# Patient Record
Sex: Male | Born: 1970 | Race: White | Hispanic: No | Smoking: Never smoker
Health system: Southern US, Community
[De-identification: ages and names within clinical notes are randomized; demographics above are authoritative.]

## PROBLEM LIST (undated history)

## (undated) DIAGNOSIS — I10 Essential (primary) hypertension: Secondary | ICD-10-CM

## (undated) DIAGNOSIS — M109 Gout, unspecified: Secondary | ICD-10-CM

## (undated) DIAGNOSIS — E785 Hyperlipidemia, unspecified: Secondary | ICD-10-CM

## (undated) HISTORY — DX: Gout, unspecified: M10.9

## (undated) HISTORY — DX: Hyperlipidemia, unspecified: E78.5

---

## 1997-05-13 HISTORY — PX: HAND RECONSTRUCTION: SHX1730

## 1998-02-26 ENCOUNTER — Emergency Department (HOSPITAL_COMMUNITY): Admission: EM | Admit: 1998-02-26 | Discharge: 1998-02-26 | Payer: Self-pay | Admitting: Emergency Medicine

## 1998-02-28 ENCOUNTER — Ambulatory Visit (HOSPITAL_COMMUNITY): Admission: RE | Admit: 1998-02-28 | Discharge: 1998-02-28 | Payer: Self-pay | Admitting: Orthopaedic Surgery

## 1998-06-01 ENCOUNTER — Emergency Department (HOSPITAL_COMMUNITY): Admission: EM | Admit: 1998-06-01 | Discharge: 1998-06-01 | Payer: Self-pay | Admitting: Emergency Medicine

## 2001-06-26 ENCOUNTER — Emergency Department (HOSPITAL_COMMUNITY): Admission: EM | Admit: 2001-06-26 | Discharge: 2001-06-26 | Payer: Self-pay | Admitting: Emergency Medicine

## 2011-10-11 ENCOUNTER — Encounter: Payer: Self-pay | Admitting: *Deleted

## 2012-03-31 ENCOUNTER — Encounter: Payer: Self-pay | Admitting: Cardiology

## 2013-07-18 ENCOUNTER — Emergency Department (HOSPITAL_BASED_OUTPATIENT_CLINIC_OR_DEPARTMENT_OTHER): Payer: BC Managed Care – PPO

## 2013-07-18 ENCOUNTER — Encounter (HOSPITAL_BASED_OUTPATIENT_CLINIC_OR_DEPARTMENT_OTHER): Payer: Self-pay | Admitting: Emergency Medicine

## 2013-07-18 ENCOUNTER — Emergency Department (HOSPITAL_BASED_OUTPATIENT_CLINIC_OR_DEPARTMENT_OTHER)
Admission: EM | Admit: 2013-07-18 | Discharge: 2013-07-18 | Disposition: A | Discharge status: Home / Self Care | Payer: BC Managed Care – PPO | Attending: Emergency Medicine | Admitting: Emergency Medicine

## 2013-07-18 DIAGNOSIS — Z79899 Other long term (current) drug therapy: Secondary | ICD-10-CM | POA: Insufficient documentation

## 2013-07-18 DIAGNOSIS — X58XXXA Exposure to other specified factors, initial encounter: Secondary | ICD-10-CM | POA: Insufficient documentation

## 2013-07-18 DIAGNOSIS — S93609A Unspecified sprain of unspecified foot, initial encounter: Secondary | ICD-10-CM | POA: Insufficient documentation

## 2013-07-18 DIAGNOSIS — Y939 Activity, unspecified: Secondary | ICD-10-CM | POA: Insufficient documentation

## 2013-07-18 DIAGNOSIS — Y929 Unspecified place or not applicable: Secondary | ICD-10-CM | POA: Insufficient documentation

## 2013-07-18 DIAGNOSIS — I10 Essential (primary) hypertension: Secondary | ICD-10-CM | POA: Insufficient documentation

## 2013-07-18 HISTORY — DX: Essential (primary) hypertension: I10

## 2013-07-18 MED ORDER — MELOXICAM 7.5 MG PO TABS: 7.5000 mg | ORAL_TABLET | Freq: Every day | ORAL | Status: DC

## 2013-07-18 MED ORDER — HYDROCODONE-ACETAMINOPHEN 5-325 MG PO TABS: 1.0000 | ORAL_TABLET | ORAL | Status: DC | PRN

## 2013-07-18 NOTE — ED Provider Notes (Signed)
Medical screening examination/treatment/procedure(s) were performed by non-physician practitioner and as supervising physician I was immediately available for consultation/collaboration.   EKG Interpretation None        Rolan BuccoMelanie Tayler Lassen, MD 07/18/13 1540

## 2013-07-18 NOTE — Discharge Instructions (Signed)
Foot Sprain The muscles and cord like structures which attach muscle to bone (tendons) that surround the feet are made up of units. A foot sprain can occur at the weakest spot in any of these units. This condition is most often caused by injury to or overuse of the foot, as from playing contact sports, or aggravating a previous injury, or from poor conditioning, or obesity. SYMPTOMS  Pain with movement of the foot.  Tenderness and swelling at the injury site.  Loss of strength is present in moderate or severe sprains. THE THREE GRADES OR SEVERITY OF FOOT SPRAIN ARE:  Mild (Grade I): Slightly pulled muscle without tearing of muscle or tendon fibers or loss of strength.  Moderate (Grade II): Tearing of fibers in a muscle, tendon, or at the attachment to bone, with small decrease in strength.  Severe (Grade III): Rupture of the muscle-tendon-bone attachment, with separation of fibers. Severe sprain requires surgical repair. Often repeating (chronic) sprains are caused by overuse. Sudden (acute) sprains are caused by direct injury or over-use. DIAGNOSIS  Diagnosis of this condition is usually by your own observation. If problems continue, a caregiver may be required for further evaluation and treatment. X-rays may be required to make sure there are not breaks in the bones (fractures) present. Continued problems may require physical therapy for treatment. PREVENTION  Use strength and conditioning exercises appropriate for your sport.  Warm up properly prior to working out.  Use athletic shoes that are made for the sport you are participating in.  Allow adequate time for healing. Early return to activities makes repeat injury more likely, and can lead to an unstable arthritic foot that can result in prolonged disability. Mild sprains generally heal in 3 to 10 days, with moderate and severe sprains taking 2 to 10 weeks. Your caregiver can help you determine the proper time required for  healing. HOME CARE INSTRUCTIONS   Apply ice to the injury for 15-20 minutes, 03-04 times per day. Put the ice in a plastic bag and place a towel between the bag of ice and your skin.  An elastic wrap (like an Ace bandage) may be used to keep swelling down.  Keep foot above the level of the heart, or at least raised on a footstool, when swelling and pain are present.  Try to avoid use other than gentle range of motion while the foot is painful. Do not resume use until instructed by your caregiver. Then begin use gradually, not increasing use to the point of pain. If pain does develop, decrease use and continue the above measures, gradually increasing activities that do not cause discomfort, until you gradually achieve normal use.  Use crutches if and as instructed, and for the length of time instructed.  Keep injured foot and ankle wrapped between treatments.  Massage foot and ankle for comfort and to keep swelling down. Massage from the toes up towards the knee.  Only take over-the-counter or prescription medicines for pain, discomfort, or fever as directed by your caregiver. SEEK IMMEDIATE MEDICAL CARE IF:   Your pain and swelling increase, or pain is not controlled with medications.  You have loss of feeling in your foot or your foot turns cold or blue.  You develop new, unexplained symptoms, or an increase of the symptoms that brought you to your caregiver. MAKE SURE YOU:   Understand these instructions.  Will watch your condition.  Will get help right away if you are not doing well or get worse. Document Released:   10/19/2001 Document Revised: 07/22/2011 Document Reviewed: 12/17/2007 [employer] Patient Information 2014 [employer].  

## 2013-07-18 NOTE — ED Notes (Signed)
Pain left great toe.  No known inj

## 2013-07-18 NOTE — ED Notes (Signed)
Disk given to Patient via Bradee Common

## 2013-07-18 NOTE — ED Provider Notes (Signed)
CSN: [id number]     Arrival date & time 07/18/13  1115 History   First MD Initiated Contact with Patient 07/18/13 1203     Chief Complaint  Patient presents with  . Toe Pain     (Consider location/radiation/quality/duration/timing/severity/associated sxs/prior Treatment) Patient is a 43 y.o. male presenting with toe pain. The history is provided by the patient.  Toe Pain This is a new problem. The current episode started in the past 7 days. The problem occurs constantly. The problem has been gradually worsening. The symptoms are aggravated by standing and walking. He has tried nothing for the symptoms.   Carlene Coriadward G Bookbinder is a 43 y.o. male who presents to the ED with pain in the left great toe that started 3 days ago and has gotten worse. He does not remember any injury to the area. The pain radiates to the top of the foot. The pain is moderate.  Past Medical History  Diagnosis Date  . Hypertension    Past Surgical History  Procedure Laterality Date  . Orthopedic surgery     Family History  Problem Relation Age of Onset  . Hypertension Mother   . Hypertension Father   . Cancer Father    History  Substance Use Topics  . Smoking status: Never Smoker   . Smokeless tobacco: Not on file  . Alcohol Use: No    Review of Systems Negative except as stated in HPI   Allergies  Review of patient's allergies indicates no known allergies.  Home Medications   Current Outpatient Rx  Name  Route  Sig  Dispense  Refill  . fish oil-omega-3 fatty acids 1000 MG capsule   Oral   Take 2 g by mouth daily.         . hydrochlorothiazide (HYDRODIURIL) 25 MG tablet   Oral   Take 25 mg by mouth daily.         Marland Kitchen. lisinopril (PRINIVIL,ZESTRIL) 5 MG tablet   Oral   Take 5 mg by mouth daily.          BP 146/76  Pulse 88  Temp(Src) 98.3 F (36.8 C) (Oral)  Resp 18  Ht 6' (1.829 m)  Wt 215 lb (97.523 kg)  BMI 29.15 kg/m2  SpO2 100% Physical Exam  Nursing note and vitals  reviewed. Constitutional: He is oriented to person, place, and time. He appears well-developed and well-nourished. No distress.  HENT:  Head: Normocephalic.  Eyes: EOM are normal.  Neck: Neck supple.  Cardiovascular: Normal rate.   Pulmonary/Chest: Effort normal.  Musculoskeletal: Normal range of motion.       Right foot: He exhibits tenderness and swelling. He exhibits normal range of motion, normal capillary refill, no deformity and no laceration.       Feet:  Tenderness with palpation to the base of the left great toe. Tender on the dorsum of the foot near the ankle. Pedal pulse strong, adequate circulation, good touch sensation.   Neurological: He is alert and oriented to person, place, and time. No cranial nerve deficit.  Skin: Skin is warm and dry.  Psychiatric: He has a normal mood and affect. His behavior is normal.    ED Course  Procedures  Dg Foot Complete Left  07/18/2013   CLINICAL DATA:  Foot pain.  No known injury.  EXAM: LEFT FOOT - COMPLETE 3+ VIEW  COMPARISON:  None.  FINDINGS: There is no evidence of fracture or dislocation. There is no evidence of arthropathy or other focal  bone abnormality. Soft tissues are unremarkable.  IMPRESSION: Negative.   Electronically Signed   By: Charlett Nose M.D.   On: 07/18/2013 12:22    MDM  43 y.o. male with pain in his left great toe and dorsum of the left foot. Will treat for foot sprain/tendonitis. Ace wrap applied, mediation for pain and inflammation. He will follow up with his PCP or return here as needed for worsening symptoms.  Discussed with the patient and all questioned fully answered.    Medication List    TAKE these medications       HYDROcodone-acetaminophen 5-325 MG per tablet  Commonly known as:  NORCO/VICODIN  Take 1 tablet by mouth every 4 (four) hours as needed.     meloxicam 7.5 MG tablet  Commonly known as:  MOBIC  Take 1 tablet (7.5 mg total) by mouth daily.      ASK your doctor about these medications        fish oil-omega-3 fatty acids 1000 MG capsule  Take 2 g by mouth daily.     hydrochlorothiazide 25 MG tablet  Commonly known as:  HYDRODIURIL  Take 25 mg by mouth daily.     lisinopril 5 MG tablet  Commonly known as:  PRINIVIL,ZESTRIL  Take 5 mg by mouth daily.         [facility] Orlene Och, NP 07/18/13 1520

## 2015-06-10 DIAGNOSIS — E782 Mixed hyperlipidemia: Secondary | ICD-10-CM | POA: Insufficient documentation

## 2015-06-10 DIAGNOSIS — J309 Allergic rhinitis, unspecified: Secondary | ICD-10-CM | POA: Insufficient documentation

## 2015-06-10 DIAGNOSIS — I10 Essential (primary) hypertension: Secondary | ICD-10-CM | POA: Insufficient documentation

## 2015-06-10 DIAGNOSIS — M5412 Radiculopathy, cervical region: Secondary | ICD-10-CM | POA: Insufficient documentation

## 2015-06-10 DIAGNOSIS — E559 Vitamin D deficiency, unspecified: Secondary | ICD-10-CM | POA: Insufficient documentation

## 2015-08-21 DIAGNOSIS — E781 Pure hyperglyceridemia: Secondary | ICD-10-CM | POA: Insufficient documentation

## 2016-05-02 ENCOUNTER — Encounter: Payer: Self-pay | Admitting: Family Medicine

## 2016-05-02 ENCOUNTER — Ambulatory Visit (INDEPENDENT_AMBULATORY_CARE_PROVIDER_SITE_OTHER): Payer: BLUE CROSS/BLUE SHIELD | Admitting: Family Medicine

## 2016-05-02 DIAGNOSIS — I10 Essential (primary) hypertension: Secondary | ICD-10-CM | POA: Insufficient documentation

## 2016-05-02 DIAGNOSIS — M109 Gout, unspecified: Secondary | ICD-10-CM

## 2016-05-02 MED ORDER — LISINOPRIL 20 MG PO TABS: 20.0000 mg | ORAL_TABLET | Freq: Every day | ORAL | 3 refills | Status: DC

## 2016-05-02 MED ORDER — METHYLPREDNISOLONE ACETATE 80 MG/ML IJ SUSP
80.0000 mg | Freq: Once | INTRAMUSCULAR | Status: AC
  Administered 2016-05-02: 80 mg via INTRAMUSCULAR

## 2016-05-02 MED ORDER — COLCHICINE 0.6 MG PO TABS: 0.6000 mg | ORAL_TABLET | Freq: Every day | ORAL | 1 refills | Status: DC

## 2016-05-02 NOTE — Assessment & Plan Note (Signed)
Stopped hydrochlorothiazide and sent new prescription for lisinopril

## 2016-05-02 NOTE — Progress Notes (Signed)
BP 127/88   Pulse 93   Temp 97.6 F (36.4 C) (Oral)   Ht 6' (1.829 m)   Wt 231 lb 8 oz (105 kg)   BMI 31.40 kg/m    Subjective:    Patient ID: Jordan DeutscherEdward L Webb, male    DOB: December 13, 1970, 45 y.o.   MRN: [id number]  HPI: Jordan Webb is a 45 y.o. male presenting on 05/02/2016 for Establish Care (history of gout, began about 5 weeks ago. Pain in right foot -  [place] saw a nurse who prescribed Alllopurinol, which has helped but still having episodes of pain.  Had an old prescription of Colchicine and took those but it didn't seem to help either.)   HPI Gout flare Patient is coming in today for a gout flare with been going on for about 5 weeks. He was seen at his workplace clinic and was started on allopurinol and had some colchicine but ran out of those. The gout flare is in his right great toe and second toe. He says over the past week it is spread to the second toe. He says he has had flares in his knees before but mostly involvement in his legs or his toes. He says that he has had about 4 flares a year for the past couple years. He is also been using Advil to help with this flare. He says currently his pain is actually down to 3 out of 10 but it keeps flaring up from day-to-day. He denies any fevers or chills. The redness and warmth has gone down significantly today.  Hypertension Patient comes to establish care with our office for hypertension. He is currently on lisinopril-hydrochlorothiazide. I recommended that he stop the hydrochlorothiazide because of the possibility of increasing uric acid and he thinks now after saying that the all of his gout might have started soon after the hydrochlorothiazide was started. This wasn't a bout 3 or 4 years ago. Patient denies headaches, blurred vision, chest pains, shortness of breath, or weakness.    Relevant past medical, surgical, family and social history reviewed and updated as indicated. Interim medical history since our last  visit reviewed. Allergies and medications reviewed and updated.  Review of Systems  Constitutional: Negative for chills and fever.  Respiratory: Negative for shortness of breath and wheezing.   Cardiovascular: Negative for chest pain and leg swelling.  Musculoskeletal: Positive for arthralgias and joint swelling. Negative for back pain and gait problem.  Skin: Negative for rash.  All other systems reviewed and are negative.   Per HPI unless specifically indicated above  Social History   Social History  . Marital status: Divorced    Spouse name: N/A  . Number of children: N/A  . Years of education: N/A   Occupational History  . Not on file.   Social History Main Topics  . Smoking status: Never Smoker  . Smokeless tobacco: Never Used  . Alcohol use No  . Drug use: No  . Sexual activity: Not Currently     Comment: divorced since 2012   Other Topics Concern  . Not on file   Social History Narrative  . No narrative on file    Past Surgical History:  Procedure Laterality Date  . HAND RECONSTRUCTION Right 1999  . ORTHOPEDIC SURGERY      Family History  Problem Relation Age of Onset  . Hypertension Mother   . Cancer Mother     bones, kidneys  . Hypertension Father   .  Cancer Father     skin cancer  . Heart disease Father     [id number] 341 years old  . Cancer Maternal Grandmother   62. Heart disease Maternal Grandfather   . Cancer Paternal Grandmother   . Heart disease Paternal Grandfather     Allergies as of 05/02/2016      Reactions   Prednisone Other (See Comments)      Medication List       Accurate as of 05/02/16  4:05 PM. Always use your most recent med list.          allopurinol 300 MG tablet Commonly known as:  ZYLOPRIM Take 300 mg by mouth daily.   colchicine 0.6 MG tablet Take 1 tablet (0.6 mg total) by mouth daily.   fish oil-omega-3 fatty acids 1000 MG capsule Take 2 g by mouth daily.   lisinopril 20 MG tablet Commonly known as:   PRINIVIL,ZESTRIL Take 1 tablet (20 mg total) by mouth daily.          Objective:    BP 127/88   Pulse 93   Temp 97.6 F (36.4 C) (Oral)   Ht 6' (1.829 m)   Wt 231 lb 8 oz (105 kg)   BMI 31.40 kg/m   Wt Readings from Last 3 Encounters:  05/02/16 231 lb 8 oz (105 kg)  07/18/13 215 lb (97.5 kg)    Physical Exam  Constitutional: He is oriented to person, place, and time. He appears well-developed and well-nourished. No distress.  Eyes: Conjunctivae are normal. Right eye exhibits no discharge. Left eye exhibits no discharge. No scleral icterus.  Cardiovascular: Normal rate, regular rhythm, normal heart sounds and intact distal pulses.   No murmur heard. Pulmonary/Chest: Effort normal and breath sounds normal. No respiratory distress. He has no wheezes. He has no rales.  Musculoskeletal: Normal range of motion. He exhibits no edema.       Right foot: There is tenderness (Slight tenderness to palpation and swelling and inflammation surrounding the right first and second MTP joints), bony tenderness and swelling. There is normal range of motion, normal capillary refill and no deformity.  Neurological: He is alert and oriented to person, place, and time. Coordination normal.  Skin: Skin is warm and dry. No rash noted. He is not diaphoretic.  Psychiatric: He has a normal mood and affect. His behavior is normal.  Nursing note and vitals reviewed.   No results found for this or any previous visit.    Assessment & Plan:   Problem List Items Addressed This Visit      Cardiovascular and Mediastinum   Essential hypertension, benign    Stopped hydrochlorothiazide and sent new prescription for lisinopril      Relevant Medications   lisinopril (PRINIVIL,ZESTRIL) 20 MG tablet     Other   Gout    Patient just started allopurinol 5 weeks ago, will stop hydrochlorothiazide and gave double shot, continue colchicine and written prescription for it      Relevant Medications   colchicine  0.6 MG tablet   methylPREDNISolone acetate (DEPO-MEDROL) injection 80 mg (Completed)   Other Relevant Orders   Uric acid       Follow up plan: Return in about 4 weeks (around 05/30/2016), or if symptoms worsen or fail to improve, for Hypertension recheck.  Arville CareJoshua Earleen Aoun, MD [facility] 05/02/2016, 4:05 PM

## 2016-05-02 NOTE — Assessment & Plan Note (Signed)
Patient just started allopurinol 5 weeks ago, will stop hydrochlorothiazide and gave double shot, continue colchicine and written prescription for it

## 2016-05-03 LAB — URIC ACID: Uric Acid: 4.7 mg/dL (ref 3.7–8.6)

## 2016-06-03 ENCOUNTER — Ambulatory Visit (INDEPENDENT_AMBULATORY_CARE_PROVIDER_SITE_OTHER): Payer: BLUE CROSS/BLUE SHIELD | Admitting: Family Medicine

## 2016-06-03 ENCOUNTER — Encounter: Payer: Self-pay | Admitting: Family Medicine

## 2016-06-03 VITALS — BP 148/90 | HR 67 | Temp 97.7°F | Ht 72.0 in | Wt 228.6 lb

## 2016-06-03 DIAGNOSIS — I10 Essential (primary) hypertension: Secondary | ICD-10-CM

## 2016-06-03 MED ORDER — LISINOPRIL 40 MG PO TABS: 40.0000 mg | ORAL_TABLET | Freq: Every day | ORAL | 2 refills | Status: DC

## 2016-06-03 NOTE — Progress Notes (Signed)
   BP (!) 143/93   Pulse 68   Temp 97.7 F (36.5 C) (Oral)   Ht 6' (1.829 m)   Wt 228 lb 9.6 oz (103.7 kg)   BMI 31.00 kg/m    Subjective:    Patient ID: Jordan Webb, male    DOB: 06/17/1970, 46 y.o.   MRN: [id number]  HPI: Jordan Deutscherdward L Webb is a 46 y.o. male presenting on 06/03/2016 for Hypertension (& go over labs that were done at work)   HPI Hypertension Patient is coming in today for hypertension recheck. His blood pressure is 143/93. Has been elevated as we took away his hydrochlorothiazide because of his gout. His blood pressure was doing well on just lisinopril 20 mg but now has started to increase and today is slightly elevated. Patient denies headaches, blurred vision, chest pains, shortness of breath, or weakness. Denies any side effects from medication and is content with current medication.   Relevant past medical, surgical, family and social history reviewed and updated as indicated. Interim medical history since our last visit reviewed. Allergies and medications reviewed and updated.  Review of Systems  Constitutional: Negative for chills and fever.  Eyes: Negative for discharge.  Respiratory: Negative for shortness of breath and wheezing.   Cardiovascular: Negative for chest pain and leg swelling.  Musculoskeletal: Negative for back pain and gait problem.  Skin: Negative for rash.  Neurological: Negative for dizziness, weakness, light-headedness and headaches.  All other systems reviewed and are negative.   Per HPI unless specifically indicated above      Objective:    BP (!) 143/93   Pulse 68   Temp 97.7 F (36.5 C) (Oral)   Ht 6' (1.829 m)   Wt 228 lb 9.6 oz (103.7 kg)   BMI 31.00 kg/m   Wt Readings from Last 3 Encounters:  06/03/16 228 lb 9.6 oz (103.7 kg)  05/02/16 231 lb 8 oz (105 kg)  07/18/13 215 lb (97.5 kg)    Physical Exam  Constitutional: He is oriented to person, place, and time. He appears well-developed and well-nourished. No  distress.  Eyes: Conjunctivae are normal. Right eye exhibits no discharge. Left eye exhibits no discharge. No scleral icterus.  Neck: Neck supple. No thyromegaly present.  Cardiovascular: Normal rate, regular rhythm, normal heart sounds and intact distal pulses.   No murmur heard. Pulmonary/Chest: Effort normal and breath sounds normal. No respiratory distress. He has no wheezes. He has no rales.  Musculoskeletal: Normal range of motion. He exhibits no edema.  Lymphadenopathy:    He has no cervical adenopathy.  Neurological: He is alert and oriented to person, place, and time. Coordination normal.  Skin: Skin is warm and dry. No rash noted. He is not diaphoretic.  Psychiatric: He has a normal mood and affect. His behavior is normal.  Nursing note and vitals reviewed.     Assessment & Plan:   Problem List Items Addressed This Visit      Cardiovascular and Mediastinum   Essential hypertension, benign - Primary   Relevant Medications   lisinopril (PRINIVIL,ZESTRIL) 40 MG tablet       Follow up plan: No Follow-up on file.  Counseling provided for all of the vaccine components No orders of the defined types were placed in this encounter.   Arville CareJoshua Dettinger, MD [facility] 06/03/2016, 5:10 PM

## 2016-07-12 ENCOUNTER — Ambulatory Visit (INDEPENDENT_AMBULATORY_CARE_PROVIDER_SITE_OTHER): Payer: BLUE CROSS/BLUE SHIELD | Admitting: Family Medicine

## 2016-07-12 ENCOUNTER — Encounter: Payer: Self-pay | Admitting: Family Medicine

## 2016-07-12 VITALS — BP 151/94 | HR 75 | Temp 97.8°F | Ht 72.0 in | Wt 232.1 lb

## 2016-07-12 DIAGNOSIS — I10 Essential (primary) hypertension: Secondary | ICD-10-CM | POA: Diagnosis not present

## 2016-07-12 MED ORDER — AMLODIPINE BESYLATE 5 MG PO TABS: 5.0000 mg | ORAL_TABLET | Freq: Every day | ORAL | 3 refills | Status: DC

## 2016-07-12 NOTE — Progress Notes (Signed)
BP (!) 151/94   Pulse 75   Temp 97.8 F (36.6 C) (Oral)   Ht 6' (1.829 m)   Wt 232 lb 2 oz (105.3 kg)   BMI 31.48 kg/m    Subjective:    Patient ID: Jordan Webb, male    DOB: Mar 23, 1971, 46 y.o.   MRN: [id number]  HPI: Jordan Webb is a 46 y.o. male presenting on 07/12/2016 for Hypertension (followup)   HPI Hypertension recheck Patient is coming in for a hypertension recheck. His blood pressure is 151/94. We had to stop hydrochlorothiazide because of his gout and have had him on lisinopril 40 mg to see how it do by itself. He denies any lightheadedness or dizziness or headaches. He denies any issues with his medication. He denies any chest pain or shortness of breath.  Relevant past medical, surgical, family and social history reviewed and updated as indicated. Interim medical history since our last visit reviewed. Allergies and medications reviewed and updated.  Review of Systems  Constitutional: Negative for chills and fever.  Respiratory: Negative for shortness of breath and wheezing.   Cardiovascular: Negative for chest pain, palpitations and leg swelling.  Musculoskeletal: Negative for back pain and gait problem.  Skin: Negative for rash.  Neurological: Negative for dizziness and light-headedness.  All other systems reviewed and are negative.   Per HPI unless specifically indicated above   Allergies as of 07/12/2016      Reactions   Prednisone Other (See Comments)      Medication List       Accurate as of 07/12/16  5:14 PM. Always use your most recent med list.          allopurinol 300 MG tablet Commonly known as:  ZYLOPRIM Take 300 mg by mouth daily.   amLODipine 5 MG tablet Commonly known as:  NORVASC Take 1 tablet (5 mg total) by mouth daily.   colchicine 0.6 MG tablet Take 1 tablet (0.6 mg total) by mouth daily.   fish oil-omega-3 fatty acids 1000 MG capsule Take 2 g by mouth daily.   lisinopril 40 MG tablet Commonly known as:   PRINIVIL,ZESTRIL Take 1 tablet (40 mg total) by mouth daily.          Objective:    BP (!) 151/94   Pulse 75   Temp 97.8 F (36.6 C) (Oral)   Ht 6' (1.829 m)   Wt 232 lb 2 oz (105.3 kg)   BMI 31.48 kg/m   Wt Readings from Last 3 Encounters:  07/12/16 232 lb 2 oz (105.3 kg)  06/03/16 228 lb 9.6 oz (103.7 kg)  05/02/16 231 lb 8 oz (105 kg)    Physical Exam  Constitutional: He is oriented to person, place, and time. He appears well-developed and well-nourished. No distress.  Eyes: Conjunctivae are normal. No scleral icterus.  Neck: Neck supple.  Cardiovascular: Normal rate, regular rhythm, normal heart sounds and intact distal pulses.   No murmur heard. Pulmonary/Chest: Effort normal and breath sounds normal. No respiratory distress. He has no wheezes. He has no rales.  Musculoskeletal: Normal range of motion. He exhibits no edema.  Lymphadenopathy:    He has no cervical adenopathy.  Neurological: He is alert and oriented to person, place, and time. Coordination normal.  Skin: Skin is warm and dry. No rash noted. He is not diaphoretic.  Psychiatric: He has a normal mood and affect. His behavior is normal.  Nursing note and vitals reviewed.     Assessment &  Plan:   Problem List Items Addressed This Visit      Cardiovascular and Mediastinum   Essential hypertension, benign - Primary   Relevant Medications   amLODipine (NORVASC) 5 MG tablet       Follow up plan: Return in about 4 weeks (around 08/09/2016), or if symptoms worsen or fail to improve, for Hypertension.  Counseling provided for all of the vaccine components No orders of the defined types were placed in this encounter.   Arville Care, MD [facility] 07/12/2016, 5:14 PM

## 2016-08-13 ENCOUNTER — Encounter: Payer: Self-pay | Admitting: Family Medicine

## 2016-08-13 ENCOUNTER — Ambulatory Visit (INDEPENDENT_AMBULATORY_CARE_PROVIDER_SITE_OTHER): Payer: BLUE CROSS/BLUE SHIELD | Admitting: Family Medicine

## 2016-08-13 VITALS — BP 139/84 | HR 76 | Temp 98.0°F | Ht 72.0 in | Wt 234.4 lb

## 2016-08-13 DIAGNOSIS — I10 Essential (primary) hypertension: Secondary | ICD-10-CM | POA: Diagnosis not present

## 2016-08-13 NOTE — Progress Notes (Signed)
BP 139/84   Pulse 76   Temp 98 F (36.7 C) (Oral)   Ht 6' (1.829 m)   Wt 234 lb 6 oz (106.3 kg)   BMI 31.79 kg/m    Subjective:    Patient ID: Jordan Webb, male    DOB: 1970-08-09, 46 y.o.   MRN: [id number]  HPI: Jordan Webb is a 46 y.o. male presenting on 08/13/2016 for Hypertension (4 week followup)   HPI Hypertension recheck Patient is coming in today for a blood pressure recheck. His blood pressure today is 139/84. He is currently on lisinopril and amlodipine, the amlodipine was added just at the last visit. He says he feels a lot better and feels a lot healthier. He denies any lightheadedness or dizziness. Patient denies headaches, blurred vision, chest pains, shortness of breath, or weakness. Denies any side effects from medication and is content with current medication. He says his headaches are gone that he used to have.  Relevant past medical, surgical, family and social history reviewed and updated as indicated. Interim medical history since our last visit reviewed. Allergies and medications reviewed and updated.  Review of Systems  Constitutional: Negative for chills and fever.  Respiratory: Negative for shortness of breath and wheezing.   Cardiovascular: Negative for chest pain and leg swelling.  Musculoskeletal: Negative for back pain and gait problem.  Skin: Negative for rash.  Neurological: Negative for dizziness, weakness, light-headedness, numbness and headaches.  All other systems reviewed and are negative.   Per HPI unless specifically indicated above   Allergies as of 08/13/2016      Reactions   Prednisone Other (See Comments)      Medication List       Accurate as of 08/13/16  4:51 PM. Always use your most recent med list.          allopurinol 300 MG tablet Commonly known as:  ZYLOPRIM Take 300 mg by mouth daily.   amLODipine 5 MG tablet Commonly known as:  NORVASC Take 1 tablet (5 mg total) by mouth daily.   colchicine 0.6 MG  tablet Take 1 tablet (0.6 mg total) by mouth daily.   fish oil-omega-3 fatty acids 1000 MG capsule Take 2 g by mouth daily.   lisinopril 40 MG tablet Commonly known as:  PRINIVIL,ZESTRIL Take 1 tablet (40 mg total) by mouth daily.          Objective:    BP 139/84   Pulse 76   Temp 98 F (36.7 C) (Oral)   Ht 6' (1.829 m)   Wt 234 lb 6 oz (106.3 kg)   BMI 31.79 kg/m   Wt Readings from Last 3 Encounters:  08/13/16 234 lb 6 oz (106.3 kg)  07/12/16 232 lb 2 oz (105.3 kg)  06/03/16 228 lb 9.6 oz (103.7 kg)    Physical Exam  Constitutional: He is oriented to person, place, and time. He appears well-developed and well-nourished. No distress.  Eyes: Conjunctivae are normal. No scleral icterus.  Neck: Neck supple. No thyromegaly present.  Cardiovascular: Normal rate, regular rhythm, normal heart sounds and intact distal pulses.   No murmur heard. Pulmonary/Chest: Effort normal and breath sounds normal. No respiratory distress. He has no wheezes.  Musculoskeletal: Normal range of motion. He exhibits no edema.  Lymphadenopathy:    He has no cervical adenopathy.  Neurological: He is alert and oriented to person, place, and time. Coordination normal.  Skin: Skin is warm and dry. No rash noted. He is not  diaphoretic.  Psychiatric: He has a normal mood and affect. His behavior is normal.  Nursing note and vitals reviewed.     Assessment & Plan:   Problem List Items Addressed This Visit      Cardiovascular and Mediastinum   Essential hypertension, benign - Primary    Continue blood pressure medication with no changes.   Follow up plan: Return if symptoms worsen or fail to improve, for Hypertension.  Counseling provided for all of the vaccine components No orders of the defined types were placed in this encounter.   Arville Care, MD [facility] 08/13/2016, 4:51 PM

## 2016-10-04 ENCOUNTER — Telehealth: Payer: Self-pay | Admitting: Family Medicine

## 2016-10-04 DIAGNOSIS — M109 Gout, unspecified: Secondary | ICD-10-CM

## 2016-10-04 MED ORDER — COLCHICINE 0.6 MG PO TABS: 0.6000 mg | ORAL_TABLET | Freq: Every day | ORAL | 1 refills | Status: DC

## 2016-10-04 NOTE — Telephone Encounter (Signed)
Patient is requesting a refill on his colchicine. Last seen 08/13/16- last refilled 05/02/16 30 r-1. Please advise

## 2016-10-04 NOTE — Telephone Encounter (Signed)
What is the name of the medication? Colchicine for gout  Have you contacted your pharmacy to request a refill? no  Which pharmacy would you like this sent to? [facility] in [place].   Patient notified that their request is being sent to the clinical staff for review and that they should receive a call once it is complete. If they do not receive a call within 24 hours they can check with their pharmacy or our office.

## 2016-10-04 NOTE — Telephone Encounter (Signed)
Rx sent. Patient aware.  

## 2016-10-04 NOTE — Telephone Encounter (Signed)
Yes go ahead and send a refill for him.

## 2016-11-11 ENCOUNTER — Encounter: Payer: Self-pay | Admitting: Family Medicine

## 2016-11-11 ENCOUNTER — Ambulatory Visit (INDEPENDENT_AMBULATORY_CARE_PROVIDER_SITE_OTHER): Payer: Commercial Managed Care - PPO | Admitting: Family Medicine

## 2016-11-11 VITALS — BP 132/82 | HR 83 | Temp 97.8°F | Ht 72.0 in | Wt 229.0 lb

## 2016-11-11 DIAGNOSIS — M25562 Pain in left knee: Secondary | ICD-10-CM | POA: Diagnosis not present

## 2016-11-11 DIAGNOSIS — M10062 Idiopathic gout, left knee: Secondary | ICD-10-CM | POA: Diagnosis not present

## 2016-11-11 LAB — CBC WITH DIFFERENTIAL/PLATELET
Basophils Absolute: 0 10*3/uL (ref 0.0–0.2)
Basos: 0 %
EOS (ABSOLUTE): 0.2 10*3/uL (ref 0.0–0.4)
EOS: 3 %
Hematocrit: 38.9 % (ref 37.5–51.0)
Hemoglobin: 13.5 g/dL (ref 13.0–17.7)
Lymphocytes Absolute: 2.1 10*3/uL (ref 0.7–3.1)
Lymphs: 29 %
MCH: 28.7 pg (ref 26.6–33.0)
MCHC: 34.7 g/dL (ref 31.5–35.7)
MCV: 83 fL (ref 79–97)
MONOS ABS: 0.8 10*3/uL (ref 0.1–0.9)
Monocytes: 12 %
Neutrophils Absolute: 4 10*3/uL (ref 1.4–7.0)
Neutrophils: 56 %
Platelets: 219 10*3/uL (ref 150–379)
RBC: 4.71 x10E6/uL (ref 4.14–5.80)
RDW: 13.9 % (ref 12.3–15.4)
WBC: 7.1 10*3/uL (ref 3.4–10.8)

## 2016-11-11 NOTE — Progress Notes (Signed)
0

## 2016-11-11 NOTE — Progress Notes (Signed)
Chief Complaint  Patient presents with  . Knee Pain    pt here today c/o left knee pain and some swelling. No recent injury, but has been walking a lot more the past week.    HPI  Patient presents today for 5 days of moderately severe left knee pain he had no known injury. He's been walking a lot over the last week but denies any misstep stumbling and falling twisting etc. He does have a history of gout. He takes allopurinol regularly. He has not tried his colchicine for this occurrence. He notes that the joint feels hot to him.  PMH: Smoking status noted ROS: Per HPI  Objective: BP 132/82   Pulse 83   Temp 97.8 F (36.6 C) (Oral)   Ht 6' (1.829 m)   Wt 229 lb (103.9 kg)   BMI 31.06 kg/m  Gen: NAD, alert, cooperative with exam HEENT: NCAT, EOMI, PERRL CV: RRR, good S1/S2, no murmur Resp: CTABL, no wheezes, non-labored Ext: No edema, warm the left knee is warm to touch mild erythema noted. Minimal effusion. There is moderate tenderness at the anterior joint line and patellar plica. Neuro: Alert and oriented, No gross deficits  Assessment and plan:  1. Acute pain of left knee   2. Acute idiopathic gout of left knee     Meds ordered this encounter  Medications  . VITAMIN D, ERGOCALCIFEROL, PO    Sig: Take by mouth.    Orders Placed This Encounter  Procedures  . CBC with Differential/Platelet  . Uric acid   Due to the possibility of low-grade infection of the joint has noted from the redness and heat , a stat CBC was sent and is pending at this time Follow up as needed.  Mechele ClaudeWarren Hudsyn Barich, MD

## 2016-11-12 ENCOUNTER — Other Ambulatory Visit: Payer: Self-pay | Admitting: Family Medicine

## 2016-11-12 LAB — URIC ACID: Uric Acid: 5.3 mg/dL (ref 3.7–8.6)

## 2016-11-12 MED ORDER — ALLOPURINOL 300 MG PO TABS: 300.0000 mg | ORAL_TABLET | Freq: Every day | ORAL | 1 refills | Status: DC

## 2016-11-12 NOTE — Telephone Encounter (Signed)
Pt aware and med refilled

## 2017-02-12 ENCOUNTER — Ambulatory Visit (INDEPENDENT_AMBULATORY_CARE_PROVIDER_SITE_OTHER): Payer: Commercial Managed Care - PPO | Admitting: Family Medicine

## 2017-02-12 ENCOUNTER — Encounter: Payer: Self-pay | Admitting: Family Medicine

## 2017-02-12 VITALS — BP 125/81 | HR 83 | Temp 98.3°F | Ht 72.0 in | Wt 235.0 lb

## 2017-02-12 DIAGNOSIS — I10 Essential (primary) hypertension: Secondary | ICD-10-CM

## 2017-02-12 DIAGNOSIS — B078 Other viral warts: Secondary | ICD-10-CM | POA: Diagnosis not present

## 2017-02-12 MED ORDER — AMLODIPINE BESYLATE 5 MG PO TABS: 5.0000 mg | ORAL_TABLET | Freq: Every day | ORAL | 1 refills | Status: DC

## 2017-02-12 MED ORDER — LISINOPRIL 40 MG PO TABS: 40.0000 mg | ORAL_TABLET | Freq: Every day | ORAL | 1 refills | Status: DC

## 2017-02-12 NOTE — Progress Notes (Signed)
BP 125/81   Pulse 83   Temp 98.3 F (36.8 C) (Oral)   Ht 6' (1.829 m)   Wt 235 lb (106.6 kg)   BMI 31.87 kg/m    Subjective:    Patient ID: Jordan Webb, male    DOB: Sep 25, 1970, 46 y.o.   MRN: [id number]  HPI: Jordan Webb is a 46 y.o. male presenting on 02/12/2017 for Discuss labwork and Medication Refill   HPI Hypertension Patient is currently on amlodipine and lisinopril, and their blood pressure today is 125/81. Patient denies any lightheadedness or dizziness. Patient denies headaches, blurred vision, chest pains, shortness of breath, or weakness. Denies any side effects from medication and is content with current medication.   Wart Patient has had a wart that he is tried multiple treatments for on his left forearm including home treatments and one previous cryotherapy about a year ago. He denies any drainage or pain fevers or chills. He would like to go ahead and try and get it removed again. He said he recently just caught on something and pulled it off and it bled and is really irritated and that's why he wants to get it removed.  Relevant past medical, surgical, family and social history reviewed and updated as indicated. Interim medical history since our last visit reviewed. Allergies and medications reviewed and updated.  Review of Systems  Constitutional: Negative for chills and fever.  Respiratory: Negative for shortness of breath and wheezing.   Cardiovascular: Negative for chest pain and leg swelling.  Musculoskeletal: Negative for back pain and gait problem.  Skin: Negative for rash.  Neurological: Negative for dizziness, weakness, light-headedness, numbness and headaches.  Psychiatric/Behavioral: Negative for confusion, decreased concentration, self-injury, sleep disturbance and suicidal ideas.  All other systems reviewed and are negative.   Per HPI unless specifically indicated above        Objective:    BP 125/81   Pulse 83   Temp 98.3 F  (36.8 C) (Oral)   Ht 6' (1.829 m)   Wt 235 lb (106.6 kg)   BMI 31.87 kg/m   Wt Readings from Last 3 Encounters:  02/12/17 235 lb (106.6 kg)  11/11/16 229 lb (103.9 kg)  08/13/16 234 lb 6 oz (106.3 kg)    Physical Exam  Constitutional: He is oriented to person, place, and time. He appears well-developed and well-nourished. No distress.  Eyes: Conjunctivae are normal. No scleral icterus.  Neck: Neck supple. No thyromegaly present.  Cardiovascular: Normal rate, regular rhythm, normal heart sounds and intact distal pulses.   No murmur heard. Pulmonary/Chest: Effort normal and breath sounds normal. No respiratory distress. He has no wheezes. He has no rales.  Musculoskeletal: Normal range of motion. He exhibits no edema.  Lymphadenopathy:    He has no cervical adenopathy.  Neurological: He is alert and oriented to person, place, and time. Coordination normal.  Skin: Skin is warm and dry. Lesion (wart on left posterior forearm ) noted. No rash noted. He is not diaphoretic.  Psychiatric: He has a normal mood and affect. His behavior is normal.  Nursing note and vitals reviewed.   Cryotherapy of wart: Patient is coming in for cryotherapy of wart on left posterior forearm, applied 3-10 second bursts. Patient was warned about blistering and keeping the site clean and to return in 2-3 weeks if not resolved    Assessment & Plan:   Problem List Items Addressed This Visit      Cardiovascular and Mediastinum   Essential  hypertension, benign   Relevant Medications   lisinopril (PRINIVIL,ZESTRIL) 40 MG tablet   amLODipine (NORVASC) 5 MG tablet       Follow up plan: Return in about 6 months (around 08/13/2017), or if symptoms worsen or fail to improve, for Hypertension recheck and physical.  Counseling provided for all of the vaccine components No orders of the defined types were placed in this encounter.   Arville Care, MD [facility] 02/12/2017, 3:30  PM

## 2017-06-27 ENCOUNTER — Other Ambulatory Visit: Payer: Self-pay | Admitting: Family Medicine

## 2017-06-27 DIAGNOSIS — I10 Essential (primary) hypertension: Secondary | ICD-10-CM

## 2017-06-27 MED ORDER — AMLODIPINE BESYLATE 5 MG PO TABS: 5.0000 mg | ORAL_TABLET | Freq: Every day | ORAL | 0 refills | Status: DC

## 2017-06-27 NOTE — Telephone Encounter (Signed)
Pt aware refill sent to pharmacy 

## 2017-09-01 ENCOUNTER — Encounter: Payer: Self-pay | Admitting: Family Medicine

## 2017-09-01 ENCOUNTER — Ambulatory Visit (INDEPENDENT_AMBULATORY_CARE_PROVIDER_SITE_OTHER): Payer: Commercial Managed Care - PPO | Admitting: Family Medicine

## 2017-09-01 VITALS — BP 130/89 | HR 72 | Temp 97.8°F | Ht 72.0 in | Wt 235.0 lb

## 2017-09-01 DIAGNOSIS — I10 Essential (primary) hypertension: Secondary | ICD-10-CM

## 2017-09-01 DIAGNOSIS — M1A9XX0 Chronic gout, unspecified, without tophus (tophi): Secondary | ICD-10-CM | POA: Diagnosis not present

## 2017-09-01 MED ORDER — AMLODIPINE BESYLATE 5 MG PO TABS: 5.0000 mg | ORAL_TABLET | Freq: Every day | ORAL | 3 refills | Status: DC

## 2017-09-01 MED ORDER — LISINOPRIL 40 MG PO TABS: 40.0000 mg | ORAL_TABLET | Freq: Every day | ORAL | 3 refills | Status: DC

## 2017-09-01 NOTE — Progress Notes (Signed)
BP 130/89   Pulse 72   Temp 97.8 F (36.6 C) (Oral)   Ht 6' (1.829 m)   Wt 235 lb (106.6 kg)   BMI 31.87 kg/m    Subjective:    Patient ID: Jordan Webb, male    DOB: 1970/10/08, 47 y.o.   MRN: [id number]  HPI: Jordan Webb is a 47 y.o. male presenting on 09/01/2017 for Medication Refill   HPI Hypertension Patient is currently on amlodipine and lisinopril, and their blood pressure today is 130/89. Patient denies any lightheadedness or dizziness. Patient denies headaches, blurred vision, chest pains, shortness of breath, or weakness. Denies any side effects from medication and is content with current medication.   Gout Patient has made some dietary changes and came off the allopurinol 7 months ago and has not had any issues since and is happy with where he is at currently.  Relevant past medical, surgical, family and social history reviewed and updated as indicated. Interim medical history since our last visit reviewed. Allergies and medications reviewed and updated.  Review of Systems  Constitutional: Negative for chills and fever.  Eyes: Negative for visual disturbance.  Respiratory: Negative for cough, shortness of breath and wheezing.   Cardiovascular: Negative for chest pain, palpitations and leg swelling.  Gastrointestinal: Negative for abdominal pain, blood in stool, constipation and diarrhea.  Genitourinary: Negative for dysuria and hematuria.  Musculoskeletal: Negative for back pain and myalgias.  Skin: Negative for rash.  Neurological: Negative for dizziness, weakness and headaches.  Psychiatric/Behavioral: Negative for suicidal ideas.    Per HPI unless specifically indicated above   Allergies as of 09/01/2017      Reactions   Prednisone Other (See Comments)      Medication List        Accurate as of 09/01/17  2:44 PM. Always use your most recent med list.          allopurinol 300 MG tablet Commonly known as:  ZYLOPRIM Take 1 tablet (300 mg  total) by mouth daily.   amLODipine 5 MG tablet Commonly known as:  NORVASC Take 1 tablet (5 mg total) by mouth daily.   colchicine 0.6 MG tablet Take 1 tablet (0.6 mg total) by mouth daily.   lisinopril 40 MG tablet Commonly known as:  PRINIVIL,ZESTRIL Take 1 tablet (40 mg total) by mouth daily.   VITAMIN D (ERGOCALCIFEROL) PO Take by mouth.          Objective:    BP 130/89   Pulse 72   Temp 97.8 F (36.6 C) (Oral)   Ht 6' (1.829 m)   Wt 235 lb (106.6 kg)   BMI 31.87 kg/m   Wt Readings from Last 3 Encounters:  09/01/17 235 lb (106.6 kg)  02/12/17 235 lb (106.6 kg)  11/11/16 229 lb (103.9 kg)    Physical Exam  Constitutional: He is oriented to person, place, and time. He appears well-developed and well-nourished. No distress.  Eyes: Conjunctivae are normal. Right eye exhibits no discharge. No scleral icterus.  Neck: Neck supple. No thyromegaly present.  Cardiovascular: Normal rate, regular rhythm, normal heart sounds and intact distal pulses.  No murmur heard. Pulmonary/Chest: Effort normal and breath sounds normal. No respiratory distress. He has no wheezes.  Musculoskeletal: Normal range of motion. He exhibits no edema.  Lymphadenopathy:    He has no cervical adenopathy.  Neurological: He is alert and oriented to person, place, and time. Coordination normal.  Skin: Skin is warm and dry. No rash  noted. He is not diaphoretic.  Psychiatric: He has a normal mood and affect. His behavior is normal.  Nursing note and vitals reviewed.       Assessment & Plan:   Problem List Items Addressed This Visit      Cardiovascular and Mediastinum   Essential hypertension, benign - Primary   Relevant Medications   amLODipine (NORVASC) 5 MG tablet   lisinopril (PRINIVIL,ZESTRIL) 40 MG tablet     Other   Gout      BP looking great, labs from work look great, his creatinine was 1.26 which is borderline but otherwise his liver function and cholesterol and blood sugar  look great.  His A1c was 5.7.  Patient has been off allopurinol for 7 months and we will continue to be off of it unless he starts to have any further issues Follow up plan: Return in about 1 year (around 09/02/2018), or if symptoms worsen or fail to improve, for Hypertension recheck.  Counseling provided for all of the vaccine components No orders of the defined types were placed in this encounter.   Arville CareJoshua Olukemi Panchal, MD [facility] 09/01/2017, 2:44 PM

## 2017-12-31 ENCOUNTER — Other Ambulatory Visit: Payer: Self-pay | Admitting: Family Medicine

## 2017-12-31 NOTE — Telephone Encounter (Signed)
Pt notified he has refills of Amlodipine

## 2018-03-17 ENCOUNTER — Other Ambulatory Visit: Payer: Self-pay

## 2018-03-17 ENCOUNTER — Emergency Department (HOSPITAL_COMMUNITY)
Admission: EM | Admit: 2018-03-17 | Discharge: 2018-03-17 | Disposition: A | Discharge status: Home / Self Care | Payer: Commercial Managed Care - PPO | Attending: Emergency Medicine | Admitting: Emergency Medicine

## 2018-03-17 ENCOUNTER — Emergency Department (HOSPITAL_COMMUNITY): Payer: Commercial Managed Care - PPO

## 2018-03-17 ENCOUNTER — Encounter (HOSPITAL_COMMUNITY): Payer: Self-pay | Admitting: Emergency Medicine

## 2018-03-17 DIAGNOSIS — I10 Essential (primary) hypertension: Secondary | ICD-10-CM | POA: Insufficient documentation

## 2018-03-17 DIAGNOSIS — Z79899 Other long term (current) drug therapy: Secondary | ICD-10-CM | POA: Diagnosis not present

## 2018-03-17 DIAGNOSIS — R0789 Other chest pain: Secondary | ICD-10-CM | POA: Diagnosis present

## 2018-03-17 LAB — CBC
HCT: 45.9 % (ref 39.0–52.0)
Hemoglobin: 14.9 g/dL (ref 13.0–17.0)
MCH: 27.9 pg (ref 26.0–34.0)
MCHC: 32.5 g/dL (ref 30.0–36.0)
MCV: 85.8 fL (ref 80.0–100.0)
PLATELETS: 287 10*3/uL (ref 150–400)
RBC: 5.35 MIL/uL (ref 4.22–5.81)
RDW: 12.6 % (ref 11.5–15.5)
WBC: 7.4 10*3/uL (ref 4.0–10.5)
nRBC: 0 % (ref 0.0–0.2)

## 2018-03-17 LAB — BASIC METABOLIC PANEL
Anion gap: 8 (ref 5–15)
BUN: 13 mg/dL (ref 6–20)
CALCIUM: 9.6 mg/dL (ref 8.9–10.3)
CO2: 26 mmol/L (ref 22–32)
CREATININE: 1.36 mg/dL — AB (ref 0.61–1.24)
Chloride: 103 mmol/L (ref 98–111)
GFR calc Af Amer: >60 mL/min (ref >60)
GFR calc non Af Amer: >60 mL/min (ref >60)
Glucose, Bld: 106 mg/dL — ABNORMAL HIGH (ref 70–99)
Potassium: 3.4 mmol/L — ABNORMAL LOW (ref 3.5–5.1)
Sodium: 137 mmol/L (ref 135–145)

## 2018-03-17 LAB — I-STAT TROPONIN, ED
TROPONIN I, POC: 0 ng/mL (ref 0.00–0.08)
Troponin i, poc: 0 ng/mL (ref 0.00–0.08)

## 2018-03-17 NOTE — ED Triage Notes (Signed)
Pt reports that over the last 4 hours pt has had shortness of breath and mild chest pain radiating to left arm while laying down. Denies active chest pain at this time.

## 2018-03-17 NOTE — Discharge Instructions (Signed)
You have had two negative sets of cardiac enzymes.  Your chest xray was clear.  Please follow up with your doctor to schedule outpatient stress testing.

## 2018-03-17 NOTE — ED Provider Notes (Signed)
TIME SEEN: 5:04 AM  CHIEF COMPLAINT: chest pain  HPI: Patient is a 47 year old male with history of hypertension, borderline hyperlipidemia who presents to the emergency department with an episode of chest pain that started last night around 9:30 PM while at rest.  Describes as a "pressing" left-sided chest pain without radiation.  He did feel short of breath but no nausea, vomiting, dizziness or diaphoresis.  He is now asymptomatic.  No aggravating or alleviating factors.  He is not a smoker.  His dad did have a heart attack in his 69s but no history of premature CAD in his family.  Last stress test was approximately 8 to 9 years ago and was normal.  No history of cardiac catheterization.  He is slightly hypertensive here but has not had his home medications this morning.  No history of PE, DVT, exogenous estrogen use, recent fractures, surgery, trauma, hospitalization or prolonged travel. No lower extremity swelling or pain. No calf tenderness.   ROS: See HPI Constitutional: no fever  Eyes: no drainage  ENT: no runny nose   Cardiovascular:  chest pain  Resp: SOB  GI: no vomiting GU: no dysuria Integumentary: no rash  Allergy: no hives  Musculoskeletal: no leg swelling  Neurological: no slurred speech ROS otherwise negative  PAST MEDICAL HISTORY/PAST SURGICAL HISTORY:  Past Medical History:  Diagnosis Date  . Gout   . Hyperlipidemia   . Hypertension     MEDICATIONS:  Prior to Admission medications   Medication Sig Start Date End Date Taking? Authorizing Provider  allopurinol (ZYLOPRIM) 300 MG tablet Take 1 tablet (300 mg total) by mouth daily. 11/12/16   Dettinger, Elige Radon, MD  amLODipine (NORVASC) 5 MG tablet Take 1 tablet (5 mg total) by mouth daily. 09/01/17   Dettinger, Elige Radon, MD  colchicine 0.6 MG tablet Take 1 tablet (0.6 mg total) by mouth daily. 10/04/16   Dettinger, Elige Radon, MD  lisinopril (PRINIVIL,ZESTRIL) 40 MG tablet Take 1 tablet (40 mg total) by mouth daily.  09/01/17   Dettinger, Elige Radon, MD  VITAMIN D, ERGOCALCIFEROL, PO Take by mouth.    [provider]    ALLERGIES:  Allergies  Allergen Reactions  . Prednisone Other (See Comments)    SOCIAL HISTORY:  Social History   Tobacco Use  . Smoking status: Never Smoker  . Smokeless tobacco: Never Used  Substance Use Topics  . Alcohol use: No    FAMILY HISTORY: Family History  Problem Relation Age of Onset  . Hypertension Mother   . Cancer Mother        bones, kidneys  . Hypertension Father   . Cancer Father        skin cancer  . Heart disease Father        [id number] 48 years old  . Cancer Maternal Grandmother   . Heart disease Maternal Grandfather   . Cancer Paternal Grandmother   . Heart disease Paternal Grandfather     EXAM: BP (!) 143/99 (BP Location: Left Arm)   Pulse 73   Temp 98.5 F (36.9 C) (Oral)   Resp 18   SpO2 96%  CONSTITUTIONAL: Alert and oriented and responds appropriately to questions. Well-appearing; well-nourished HEAD: Normocephalic EYES: Conjunctivae clear, pupils appear equal, EOMI ENT: normal nose; moist mucous membranes NECK: Supple, no meningismus, no nuchal rigidity, no LAD  CARD: RRR; S1 and S2 appreciated; no murmurs, no clicks, no rubs, no gallops RESP: Normal chest excursion without splinting or tachypnea; breath sounds clear and equal  bilaterally; no wheezes, no rhonchi, no rales, no hypoxia or respiratory distress, speaking full sentences ABD/GI: Normal bowel sounds; non-distended; soft, non-tender, no rebound, no guarding, no peritoneal signs, no hepatosplenomegaly BACK:  The back appears normal and is non-tender to palpation, there is no CVA tenderness EXT: Normal ROM in all joints; non-tender to palpation; no edema; normal capillary refill; no cyanosis, no calf tenderness or swelling    SKIN: Normal color for age and race; warm; no rash NEURO: Moves all extremities equally PSYCH: The patient's mood and manner are appropriate.  Grooming and personal hygiene are appropriate.  MEDICAL DECISION MAKING: Patient here with chest pain.  Currently asymptomatic.  States he was aggravated when this happened and thinks this may have been due to stress.  EKG shows no ischemic abnormality, arrhythmia, interval changes.  He has had 2 negative troponins.  Chest x-ray is clear.  He is PERC negative.  Doubt dissection.  I have recommended close follow-up with his outpatient PCP to schedule outpatient stress test.  His heart score is 3.  I do not feel he needs admission at this time.  He is comfortable with this plan.  He will take his blood pressure medicine when he gets home today.   At this time, I do not feel there is any life-threatening condition present. I have reviewed and discussed all results (EKG, imaging, lab, urine as appropriate) and exam findings with patient/family. I have reviewed nursing notes and appropriate previous records.  I feel the patient is safe to be discharged home without further emergent workup and can continue workup as an outpatient as needed. Discussed usual and customary return precautions. Patient/family verbalize understanding and are comfortable with this plan.  Outpatient follow-up has been provided if needed. All questions have been answered.     EKG Interpretation  Date/Time:  Tuesday March 17 2018 01:00:15 EST Ventricular Rate:  82 PR Interval:  130 QRS Duration: 88 QT Interval:  364 QTC Calculation: 425 R Axis:   59 Text Interpretation:  Normal sinus rhythm Normal ECG No old tracing to compare Confirmed by Dione Booze ([id number]) on 03/17/2018 3:16:25 AM         Suyash Amory, Layla Maw, DO 03/17/18 0510

## 2018-04-01 ENCOUNTER — Encounter: Payer: Self-pay | Admitting: Family Medicine

## 2018-04-01 ENCOUNTER — Ambulatory Visit (INDEPENDENT_AMBULATORY_CARE_PROVIDER_SITE_OTHER): Payer: Commercial Managed Care - PPO | Admitting: Family Medicine

## 2018-04-01 VITALS — BP 115/75 | HR 73 | Temp 97.3°F | Ht 72.0 in | Wt 232.6 lb

## 2018-04-01 DIAGNOSIS — I208 Other forms of angina pectoris: Secondary | ICD-10-CM

## 2018-04-01 DIAGNOSIS — R7989 Other specified abnormal findings of blood chemistry: Secondary | ICD-10-CM

## 2018-04-01 NOTE — Progress Notes (Signed)
BP 115/75   Pulse 73   Temp (!) 97.3 F (36.3 C) (Oral)   Ht 6' (1.829 m)   Wt 105.5 kg   BMI 31.55 kg/m    Subjective:    Patient ID: Jordan Webb, male    DOB: 29-Aug-1970, 47 y.o.   MRN: [id number]  HPI: Jordan Webb is a 47 y.o. male presenting on 04/01/2018 for Hospitalization Follow-up [facility] 11/5- chest pain. Hospital recomends that patient gets a stress test done.  Patient states that he has not had any chest pain since leaving the hospital.)   Patient presents for ER f/u for chest pain that presented 2 weeks ago. Patient arrived home from work and felt a pressure on his chest. The chest pain progressed throughout the evening and he felt pain down his left arm at which he decided to go the the ER. He denies having nausea, vomiting, diaphoresis, chest pain, or sob. The work up in the ER showed negative troponin and normal EKG. Since the patient has a family history of MI on both sides it was recommend he f/u with a cardiologist for a stress test. The patient also mentioned he had a stressful day and felt more anxious than normal and thought his episode might be related to stress. Since the ER visit the patient denies any further chest pain episodes. Patient has been working without any sob or chest pain. Patient has a history of HTN which has been well controlled. The patient had a normal stress test 7-8 years ago but does not remember with who.    Relevant past medical, surgical, family and social history reviewed and updated as indicated. Interim medical history since our last visit reviewed. Allergies and medications reviewed and updated.   Review of Systems  Constitutional: Negative for activity change, chills, diaphoresis and fever.  HENT: Negative.   Eyes: Negative.   Respiratory: Negative for chest tightness and shortness of breath.   Cardiovascular: Positive for chest pain (1 event 2 weeks ago). Negative for palpitations and leg swelling.  Gastrointestinal: Negative  for nausea and vomiting.  Musculoskeletal: Negative.   Neurological: Negative for dizziness, weakness, light-headedness and headaches.  Hematological: Negative.   Psychiatric/Behavioral: Negative.     Per HPI unless specifically indicated above   Allergies as of 04/01/2018      Reactions   Prednisone Other (See Comments)      Medication List        Accurate as of 04/01/18 11:34 AM. Always use your most recent med list.          allopurinol 300 MG tablet Commonly known as:  ZYLOPRIM Take 1 tablet (300 mg total) by mouth daily.   amLODipine 5 MG tablet Commonly known as:  NORVASC Take 1 tablet (5 mg total) by mouth daily.   colchicine 0.6 MG tablet Take 1 tablet (0.6 mg total) by mouth daily.   lisinopril 40 MG tablet Commonly known as:  PRINIVIL,ZESTRIL Take 1 tablet (40 mg total) by mouth daily.   VITAMIN D (ERGOCALCIFEROL) PO Take by mouth.          Objective:    BP 115/75   Pulse 73   Temp (!) 97.3 F (36.3 C) (Oral)   Ht 6' (1.829 m)   Wt 105.5 kg   BMI 31.55 kg/m   Wt Readings from Last 3 Encounters:  04/01/18 105.5 kg  09/01/17 106.6 kg  02/12/17 106.6 kg    Physical Exam  Constitutional: He is oriented to  person, place, and time. He appears well-developed and well-nourished. No distress.  HENT:  Head: Normocephalic and atraumatic.  Eyes: Conjunctivae are normal.  Neck: Neck supple.  Cardiovascular: Normal rate, regular rhythm, normal heart sounds and intact distal pulses. Exam reveals no gallop and no friction rub.  No murmur heard. Pulmonary/Chest: Effort normal and breath sounds normal.  Abdominal: Soft.  Musculoskeletal: He exhibits no edema.  Neurological: He is alert and oriented to person, place, and time.  Skin: Skin is warm.  Psychiatric: He has a normal mood and affect.        Assessment & Plan:   Problem List Items Addressed This Visit    None    Visit Diagnoses    Atypical angina ([place])    -  Primary   Relevant  Orders   CBC with Differential/Platelet (Completed)   CMP14+EGFR (Completed)   Ambulatory referral to Cardiology   Elevated serum creatinine       Relevant Orders   CBC with Differential/Platelet (Completed)   CMP14+EGFR (Completed)    Atypical Angina Patient had 1 event of atypical angina 2 weeks ago for which he had a negative ER work up. He says it might have been stress related. He denies any further chest pain episodes since that night. Due to age and family history I will send patient to Cardiologist for stress test and work up. Patient has history of controlled HTN. Will recheck CMP and CBC today.   Elevated serum creatinine In the ER Creatinine was slightly elevated. Possibly due to dehydration. Will recheck today.    Follow up plan: Return if symptoms worsen or fail to improve.  Counseling provided for all of the vaccine components Orders Placed This Encounter  Procedures  . CBC with Differential/Platelet  . CMP14+EGFR   Patient seen and examined with Cadence Kathlen Mody PA student, agree with assessment and plan above.  Patient is asymptomatic today but has some symptoms that could be angina and recommended to go to cardiologist for possible testing. Caryl Pina, MD [facility] 04/01/2018, 11:34 AM

## 2018-04-02 LAB — CBC WITH DIFFERENTIAL/PLATELET
BASOS ABS: 0.1 10*3/uL (ref 0.0–0.2)
Basos: 1 %
EOS (ABSOLUTE): 0.1 10*3/uL (ref 0.0–0.4)
Eos: 2 %
HEMATOCRIT: 44.9 % (ref 37.5–51.0)
Hemoglobin: 15.1 g/dL (ref 13.0–17.7)
IMMATURE GRANULOCYTES: 0 %
Immature Grans (Abs): 0 10*3/uL (ref 0.0–0.1)
LYMPHS ABS: 1.8 10*3/uL (ref 0.7–3.1)
LYMPHS: 27 %
MCH: 28.8 pg (ref 26.6–33.0)
MCHC: 33.6 g/dL (ref 31.5–35.7)
MCV: 86 fL (ref 79–97)
MONOCYTES: 9 %
MONOS ABS: 0.6 10*3/uL (ref 0.1–0.9)
Neutrophils Absolute: 4.2 10*3/uL (ref 1.4–7.0)
Neutrophils: 61 %
PLATELETS: 271 10*3/uL (ref 150–450)
RBC: 5.24 x10E6/uL (ref 4.14–5.80)
RDW: 12.9 % (ref 12.3–15.4)
WBC: 6.8 10*3/uL (ref 3.4–10.8)

## 2018-04-02 LAB — CMP14+EGFR
A/G RATIO: 2.1 (ref 1.2–2.2)
ALK PHOS: 50 IU/L (ref 39–117)
ALT: 21 IU/L (ref 0–44)
AST: 18 IU/L (ref 0–40)
Albumin: 4.4 g/dL (ref 3.5–5.5)
BILIRUBIN TOTAL: 0.3 mg/dL (ref 0.0–1.2)
BUN/Creatinine Ratio: 10 (ref 9–20)
BUN: 12 mg/dL (ref 6–24)
CHLORIDE: 104 mmol/L (ref 96–106)
CO2: 24 mmol/L (ref 20–29)
Calcium: 9.6 mg/dL (ref 8.7–10.2)
Creatinine, Ser: 1.16 mg/dL (ref 0.76–1.27)
GFR calc Af Amer: 86 mL/min/{1.73_m2} (ref >59)
GFR, EST NON AFRICAN AMERICAN: 74 mL/min/{1.73_m2} (ref >59)
Globulin, Total: 2.1 g/dL (ref 1.5–4.5)
Glucose: 87 mg/dL (ref 65–99)
POTASSIUM: 4.4 mmol/L (ref 3.5–5.2)
SODIUM: 141 mmol/L (ref 134–144)
Total Protein: 6.5 g/dL (ref 6.0–8.5)

## 2018-04-15 NOTE — Progress Notes (Signed)
Cardiology Office Note   Date:  04/17/2018   ID:  Jordan Webb, DOB [birth year], MRN [id number]  PCP:  Dettinger, Elige Radon, MD  Cardiologist:   No primary care provider on file. Referring:  Dettinger, Elige Radon, MD  Chief Complaint  Patient presents with  . Chest Pain      History of Present Illness: Jordan Webb is a 47 y.o. male who presents for evaluation of chest pain.  He was in the ED on 11/5  for this and I reviewed these records for this visit.  His ART score was 3 and there was no objective evidence of ischemia so no further work up was suggested.   He said that the discomfort happened at home.  He has been under stress.  He developed chest discomfort that was about 3 out of 10 in intensity.  It was a left upper sternal dull discomfort.  There is no radiation.  He not had this before.  He felt short of breath.  Because of this he presented to the emergency room.  Chest x-ray is unremarkable.  EKG was unremarkable.  Troponins were negative.  He was sent home for follow-up and he is had none since then.  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  Of note I did find a stress test from 2011.  This was an exercise echocardiogram that was negative for any evidence of ischemia.   Past Medical History:  Diagnosis Date  . Gout   . Hyperlipidemia   . Hypertension     Past Surgical History:  Procedure Laterality Date  . HAND RECONSTRUCTION Right 1999     Current Outpatient Medications  Medication Sig Dispense Refill  . allopurinol (ZYLOPRIM) 300 MG tablet Take 1 tablet (300 mg total) by mouth daily. 90 tablet 1  . amLODipine (NORVASC) 5 MG tablet Take 1 tablet (5 mg total) by mouth daily. 90 tablet 3  . colchicine 0.6 MG tablet Take 1 tablet (0.6 mg total) by mouth daily. 30 tablet 1  . lisinopril (PRINIVIL,ZESTRIL) 40 MG tablet Take 1 tablet (40 mg total) by mouth daily. 90 tablet 3  . VITAMIN D, ERGOCALCIFEROL, PO Take by mouth.     No  current facility-administered medications for this visit.     Allergies:   Prednisone    Social History:  The patient  reports that he has never smoked. He has never used smokeless tobacco. He reports that he does not drink alcohol or use drugs.   Family History:  The patient's family history includes Cancer in his father, maternal grandmother, mother, and paternal grandmother; Heart disease in his father, maternal grandfather, and paternal grandfather; Hypertension in his brother, father, and mother.    ROS:  Please see the history of present illness.   Otherwise, review of systems are positive for none.   All other systems are reviewed and negative.    PHYSICAL EXAM: VS:  BP (!) 144/98   Pulse 65   Ht 6' (1.829 m)   Wt 228 lb (103.4 kg)   BMI 30.92 kg/m  , BMI Body mass index is 30.92 kg/m. GENERAL:  Well appearing HEENT:  Pupils equal round and reactive, fundi not visualized, oral mucosa unremarkable NECK:  No jugular venous distention, waveform within normal limits, carotid upstroke brisk and symmetric, no bruits, no thyromegaly LYMPHATICS:  No cervical, inguinal adenopathy LUNGS:  Clear to auscultation bilaterally BACK:  No CVA tenderness CHEST:  Unremarkable HEART:  PMI not displaced or sustained,S1 and S2 within normal limits, no S3, no S4, no clicks, no rubs, no murmurs ABD:  Flat, positive bowel sounds normal in frequency in pitch, no bruits, no rebound, no guarding, no midline pulsatile mass, no hepatomegaly, no splenomegaly EXT:  2 plus pulses throughout, no edema, no cyanosis no clubbing SKIN:  No rashes no nodules NEURO:  Cranial nerves II through XII grossly intact, motor grossly intact throughout PSYCH:  Cognitively intact, oriented to person place and time    EKG:  EKG is ordered today. The ekg ordered today demonstrates sinus rhythm, rate 65, axis within normal limits, intervals within normal limits, no acute ST-T wave changes.   Recent Labs: 04/01/2018:  ALT 21; BUN 12; Creatinine, Ser 1.16; Hemoglobin 15.1; Platelets 271; Potassium 4.4; Sodium 141    Lipid Panel No results found for: CHOL, TRIG, HDL, CHOLHDL, VLDL, LDLCALC, LDLDIRECT    Wt Readings from Last 3 Encounters:  04/17/18 228 lb (103.4 kg)  04/01/18 232 lb 9.6 oz (105.5 kg)  09/01/17 235 lb (106.6 kg)      Other studies Reviewed: Additional studies/ records that were reviewed today include: ED records. Review of the above records demonstrates:  Please see elsewhere in the note.     ASSESSMENT AND PLAN:  CHEST PAIN:   The chest pain was somewhat atypical.  However, he does have cardiovascular risk factors. I will bring the patient back for a POET (Plain Old Exercise Test). This will allow me to screen for obstructive coronary disease, risk stratify and very importantly provide a prescription for exercise.   DYSLIPIDEMIA: I do not see his most recent lipids which were done at work.  He has been invited to send that to me through my chart.  HTN: His blood pressure slightly elevated today but he says it is typically in the 120s over 80s and he is can keep an eye on this.  No change in therapy.  We talked about goal therapy.  We talked about weight loss.   Current medicines are reviewed at length with the patient today.  The patient does not have concerns regarding medicines.  The following changes have been made:  no change   Labs/ tests ordered today include:   Orders Placed This Encounter  Procedures  . EXERCISE TOLERANCE TEST (ETT)  . EKG 12-Lead     Disposition:   FU with me as needed.      Signed, Rollene RotundaJames Egan Berkheimer, MD  04/17/2018 8:13 AM    [facility]

## 2018-04-17 ENCOUNTER — Encounter: Payer: Self-pay | Admitting: Cardiology

## 2018-04-17 ENCOUNTER — Ambulatory Visit (INDEPENDENT_AMBULATORY_CARE_PROVIDER_SITE_OTHER): Payer: Commercial Managed Care - PPO | Admitting: Cardiology

## 2018-04-17 VITALS — BP 144/98 | HR 65 | Ht 72.0 in | Wt 228.0 lb

## 2018-04-17 DIAGNOSIS — I10 Essential (primary) hypertension: Secondary | ICD-10-CM

## 2018-04-17 DIAGNOSIS — I208 Other forms of angina pectoris: Secondary | ICD-10-CM | POA: Diagnosis not present

## 2018-04-17 DIAGNOSIS — E785 Hyperlipidemia, unspecified: Secondary | ICD-10-CM | POA: Diagnosis not present

## 2018-04-17 DIAGNOSIS — I2089 Other forms of angina pectoris: Secondary | ICD-10-CM | POA: Insufficient documentation

## 2018-04-17 NOTE — Patient Instructions (Signed)
Medication Instructions:  Continue current medications  If you need a refill on your cardiac medications before your next appointment, please call your pharmacy.  Labwork: None Ordered   If you have labs (blood work) drawn today and your tests are completely normal, you will receive your results only by: . MyChart Message (if you have MyChart) OR . A paper copy in the mail If you have any lab test that is abnormal or we need to change your treatment, we will call you to review the results.  Testing/Procedures: Your physician has requested that you have an exercise tolerance test. For further information please visit [email or web address]. Please also follow instruction sheet, as given.  Follow-Up: . You will need a follow up appointment in As Needed.   At [facility], you and your health needs are our priority.  As part of our continuing mission to provide you with exceptional heart care, we have created designated Provider Care Teams.  These Care Teams include your primary Cardiologist (physician) and Advanced Practice Providers (APPs -  Physician Assistants and Nurse Practitioners) who all work together to provide you with the care you need, when you need it.   Thank you for choosing [facility]!!      

## 2018-04-29 ENCOUNTER — Telehealth (HOSPITAL_COMMUNITY): Payer: Self-pay

## 2018-04-29 NOTE — Telephone Encounter (Signed)
Encounter complete. 

## 2018-05-01 ENCOUNTER — Encounter (HOSPITAL_COMMUNITY): Payer: Self-pay | Admitting: *Deleted

## 2018-05-01 ENCOUNTER — Ambulatory Visit (HOSPITAL_COMMUNITY)
Admission: RE | Admit: 2018-05-01 | Discharge: 2018-05-01 | Disposition: A | Discharge status: Home / Self Care | Payer: Commercial Managed Care - PPO | Source: Ambulatory Visit | Attending: Internal Medicine | Admitting: Internal Medicine

## 2018-05-01 DIAGNOSIS — I208 Other forms of angina pectoris: Secondary | ICD-10-CM | POA: Diagnosis not present

## 2018-05-01 NOTE — Progress Notes (Unsigned)
Abnormal ETT was reviewed by Dr. Hilty. Patient was given the ok to be discharged to go home. 

## 2018-05-03 LAB — EXERCISE TOLERANCE TEST
CHL CUP RESTING HR STRESS: 71 {beats}/min
CSEPED: 10 min
CSEPEW: 12.5 METS
Exercise duration (sec): 30 s
MPHR: 172 {beats}/min
Peak HR: 187 {beats}/min
Percent HR: 108 %
RPE: 19

## 2018-09-14 ENCOUNTER — Telehealth: Payer: Self-pay | Admitting: Family Medicine

## 2018-09-14 DIAGNOSIS — I10 Essential (primary) hypertension: Secondary | ICD-10-CM

## 2018-09-14 MED ORDER — LISINOPRIL 40 MG PO TABS: ORAL_TABLET | ORAL | 0 refills | Status: DC

## 2018-09-14 NOTE — Telephone Encounter (Signed)
Pt is needing a refill on lisinopril and pt is wanting to change Pharmacy to  [facility]

## 2018-09-23 ENCOUNTER — Encounter: Payer: Self-pay | Admitting: Family Medicine

## 2018-09-23 ENCOUNTER — Ambulatory Visit (INDEPENDENT_AMBULATORY_CARE_PROVIDER_SITE_OTHER): Payer: Commercial Managed Care - PPO | Admitting: Family Medicine

## 2018-09-23 ENCOUNTER — Other Ambulatory Visit: Payer: Self-pay

## 2018-09-23 DIAGNOSIS — E785 Hyperlipidemia, unspecified: Secondary | ICD-10-CM

## 2018-09-23 DIAGNOSIS — M1A9XX0 Chronic gout, unspecified, without tophus (tophi): Secondary | ICD-10-CM

## 2018-09-23 DIAGNOSIS — I10 Essential (primary) hypertension: Secondary | ICD-10-CM | POA: Diagnosis not present

## 2018-09-23 MED ORDER — AMLODIPINE BESYLATE 5 MG PO TABS: 5.0000 mg | ORAL_TABLET | Freq: Every day | ORAL | 3 refills | Status: DC

## 2018-09-23 MED ORDER — LISINOPRIL 40 MG PO TABS: ORAL_TABLET | ORAL | 3 refills | Status: DC

## 2018-09-23 NOTE — Progress Notes (Signed)
Virtual Visit via telephone Note  I connected with Jordan Webb on 09/23/18 at [phone] by telephone and verified that I am speaking with the correct person using two identifiers. Jordan Webb is currently located at work and no other people are currently with her during visit. The provider, Elige Radon Itzael Liptak, MD is located in their office at time of visit.  Call ended at [phone]  I discussed the limitations, risks, security and privacy concerns of performing an evaluation and management service by telephone and the availability of in person appointments. I also discussed with the patient that there may be a patient responsible charge related to this service. The patient expressed understanding and agreed to proceed.   History and Present Illness: Hypertension Patient is currently on amlodipine and lisinopril, and their blood pressure today is unknown but he had it checked in march at work and was told it was good. Patient denies any lightheadedness or dizziness. Patient denies headaches, blurred vision, chest pains, shortness of breath, or weakness. Denies any side effects from medication and is content with current medication.   Hyperlipidemia Patient is coming in for recheck of his hyperlipidemia. The patient is currently taking no medications and has been doing. They deny any issues with myalgias or history of liver damage from it. They deny any focal numbness or weakness or chest pain.   Gout No gout flares in over 2 years and has not been taking allopurinol in over a year.  No diagnosis found.  Outpatient Encounter Medications as of 09/23/2018  Medication Sig   allopurinol (ZYLOPRIM) 300 MG tablet Take 1 tablet (300 mg total) by mouth daily.   amLODipine (NORVASC) 5 MG tablet Take 1 tablet (5 mg total) by mouth daily.   colchicine 0.6 MG tablet Take 1 tablet (0.6 mg total) by mouth daily.   lisinopril (ZESTRIL) 40 MG tablet One tablet by mouth daily.  Needs appointment for  future refills.   VITAMIN D, ERGOCALCIFEROL, PO Take by mouth.   No facility-administered encounter medications on file as of 09/23/2018.     Review of Systems  Constitutional: Negative for chills and fever.  Eyes: Negative for visual disturbance.  Respiratory: Negative for shortness of breath and wheezing.   Cardiovascular: Negative for chest pain and leg swelling.  Musculoskeletal: Negative for back pain and gait problem.  Skin: Negative for rash.  Neurological: Negative for dizziness, weakness, light-headedness and numbness.  All other systems reviewed and are negative.   Observations/Objective: Patient sounds comfortable and in no acute distress  Assessment and Plan: Problem List Items Addressed This Visit      Cardiovascular and Mediastinum   Essential hypertension, benign - Primary   Relevant Medications   amLODipine (NORVASC) 5 MG tablet   lisinopril (ZESTRIL) 40 MG tablet     Other   Gout   Dyslipidemia       Follow Up Instructions: Follow-up in 6 months for recheck of blood pressure physical and labs    I discussed the assessment and treatment plan with the patient. The patient was provided an opportunity to ask questions and all were answered. The patient agreed with the plan and demonstrated an understanding of the instructions.   The patient was advised to call back or seek an in-person evaluation if the symptoms worsen or if the condition fails to improve as anticipated.  The above assessment and management plan was discussed with the patient. The patient verbalized understanding of and has agreed to the management plan.  Patient is aware to call the clinic if symptoms persist or worsen. Patient is aware when to return to the clinic for a follow-up visit. Patient educated on when it is appropriate to go to the emergency department.    I provided 9 minutes of non-face-to-face time during this encounter.    Nils PyleJoshua A Jood Retana, MD

## 2019-07-02 ENCOUNTER — Other Ambulatory Visit: Payer: Self-pay

## 2019-07-05 ENCOUNTER — Other Ambulatory Visit: Payer: Self-pay

## 2019-07-05 ENCOUNTER — Ambulatory Visit (INDEPENDENT_AMBULATORY_CARE_PROVIDER_SITE_OTHER): Payer: Self-pay | Admitting: Family Medicine

## 2019-07-05 ENCOUNTER — Encounter: Payer: Self-pay | Admitting: Family Medicine

## 2019-07-05 VITALS — BP 132/82 | HR 70 | Temp 98.9°F | Ht 72.0 in | Wt 233.0 lb

## 2019-07-05 DIAGNOSIS — Z024 Encounter for examination for driving license: Secondary | ICD-10-CM

## 2019-07-05 DIAGNOSIS — Z0289 Encounter for other administrative examinations: Secondary | ICD-10-CM

## 2019-07-05 LAB — URINALYSIS
Bilirubin, UA: NEGATIVE
Glucose, UA: NEGATIVE
Ketones, UA: NEGATIVE
Leukocytes,UA: NEGATIVE
Nitrite, UA: NEGATIVE
Protein,UA: NEGATIVE
RBC, UA: NEGATIVE
Specific Gravity, UA: 1.01 (ref 1.005–1.030)
Urobilinogen, Ur: 0.2 mg/dL (ref 0.2–1.0)
pH, UA: 6.5 (ref 5.0–7.5)

## 2019-07-05 NOTE — Progress Notes (Signed)
Subjective:  Patient ID: Jordan Webb, male    DOB: Mar 17, 1971  Age: 49 y.o. MRN: [id number]  CC: Private DOT    HPI Jordan Webb presents for DOT medical evaluation  Depression screen [facility] 2/9 07/05/2019 04/01/2018 09/01/2017  Decreased Interest 0 0 0  Down, Depressed, Hopeless 0 0 0  PHQ - 2 Score 0 0 0    History Jordan Webb has a past medical history of Gout, Hyperlipidemia, and Hypertension.   He has a past surgical history that includes Hand reconstruction (Right, 1999).   His family history includes Cancer in his father, maternal grandmother, mother, and paternal grandmother; Heart disease in his father, maternal grandfather, and paternal grandfather; Hypertension in his brother, father, and mother.He reports that he has never smoked. He has never used smokeless tobacco. He reports that he does not drink alcohol or use drugs.    ROS Review of Systems  Constitutional: Negative.   HENT: Negative.   Eyes: Negative for visual disturbance.  Respiratory: Negative for cough and shortness of breath.   Cardiovascular: Negative for chest pain and leg swelling.  Gastrointestinal: Negative for abdominal pain, diarrhea, nausea and vomiting.  Genitourinary: Negative for difficulty urinating.  Musculoskeletal: Negative for arthralgias and myalgias.  Skin: Negative for rash.  Neurological: Negative for headaches.  Psychiatric/Behavioral: Negative for sleep disturbance.    Objective:  BP 132/82   Pulse 70   Temp 98.9 F (37.2 C) (Temporal)   Ht 6' (1.829 m)   Wt 233 lb (105.7 kg)   BMI 31.60 kg/m   BP Readings from Last 3 Encounters:  07/05/19 132/82  04/17/18 (!) 144/98  04/01/18 115/75    Wt Readings from Last 3 Encounters:  07/05/19 233 lb (105.7 kg)  04/17/18 228 lb (103.4 kg)  04/01/18 232 lb 9.6 oz (105.5 kg)     Physical Exam Constitutional:      General: He is not in acute distress.    Appearance: He is well-developed.  HENT:     Head: Normocephalic  and atraumatic.     Right Ear: External ear normal.     Left Ear: External ear normal.     Nose: Nose normal.  Eyes:     Conjunctiva/sclera: Conjunctivae normal.     Pupils: Pupils are equal, round, and reactive to light.  Cardiovascular:     Rate and Rhythm: Normal rate and regular rhythm.     Heart sounds: Normal heart sounds. No murmur.  Pulmonary:     Effort: Pulmonary effort is normal. No respiratory distress.     Breath sounds: Normal breath sounds. No wheezing or rales.  Abdominal:     Palpations: Abdomen is soft.     Tenderness: There is no abdominal tenderness.  Musculoskeletal:        General: Normal range of motion.     Cervical back: Normal range of motion and neck supple.  Skin:    General: Skin is warm and dry.  Neurological:     Mental Status: He is alert and oriented to person, place, and time.     Deep Tendon Reflexes: Reflexes are normal and symmetric.  Psychiatric:        Behavior: Behavior normal.        Thought Content: Thought content normal.        Judgment: Judgment normal.       Assessment & Plan:   Jordan Webb was seen today for private dot .  Diagnoses and all orders for this visit:  Encounter  for examination required by Department of Transportation (DOT) -     Urinalysis       I am having Jordan Webb maintain his colchicine, (VITAMIN D, ERGOCALCIFEROL, PO), amLODipine, and lisinopril.  Allergies as of 07/05/2019      Reactions   Prednisone Other (See Comments)      Medication List       Accurate as of July 05, 2019 12:21 PM. If you have any questions, ask your nurse or doctor.        amLODipine 5 MG tablet Commonly known as: NORVASC Take 1 tablet (5 mg total) by mouth daily.   colchicine 0.6 MG tablet Take 1 tablet (0.6 mg total) by mouth daily.   lisinopril 40 MG tablet Commonly known as: ZESTRIL One tablet by mouth daily.  Needs appointment for future refills.   VITAMIN D (ERGOCALCIFEROL) PO Take by mouth.         Follow-up: Return in about 1 year (around 07/04/2020).  Claretta Fraise, M.D.

## 2019-09-20 ENCOUNTER — Other Ambulatory Visit: Payer: Self-pay | Admitting: Family Medicine

## 2019-09-20 DIAGNOSIS — I10 Essential (primary) hypertension: Secondary | ICD-10-CM

## 2019-11-27 IMAGING — DX DG CHEST 2V
2 series · 2 of 2 positions shown · non-contrast
Comparison: 07/21/2015

CLINICAL DATA: Chest pain and shortness of breath.

EXAM:
CHEST - 2 VIEW

[chest pa]
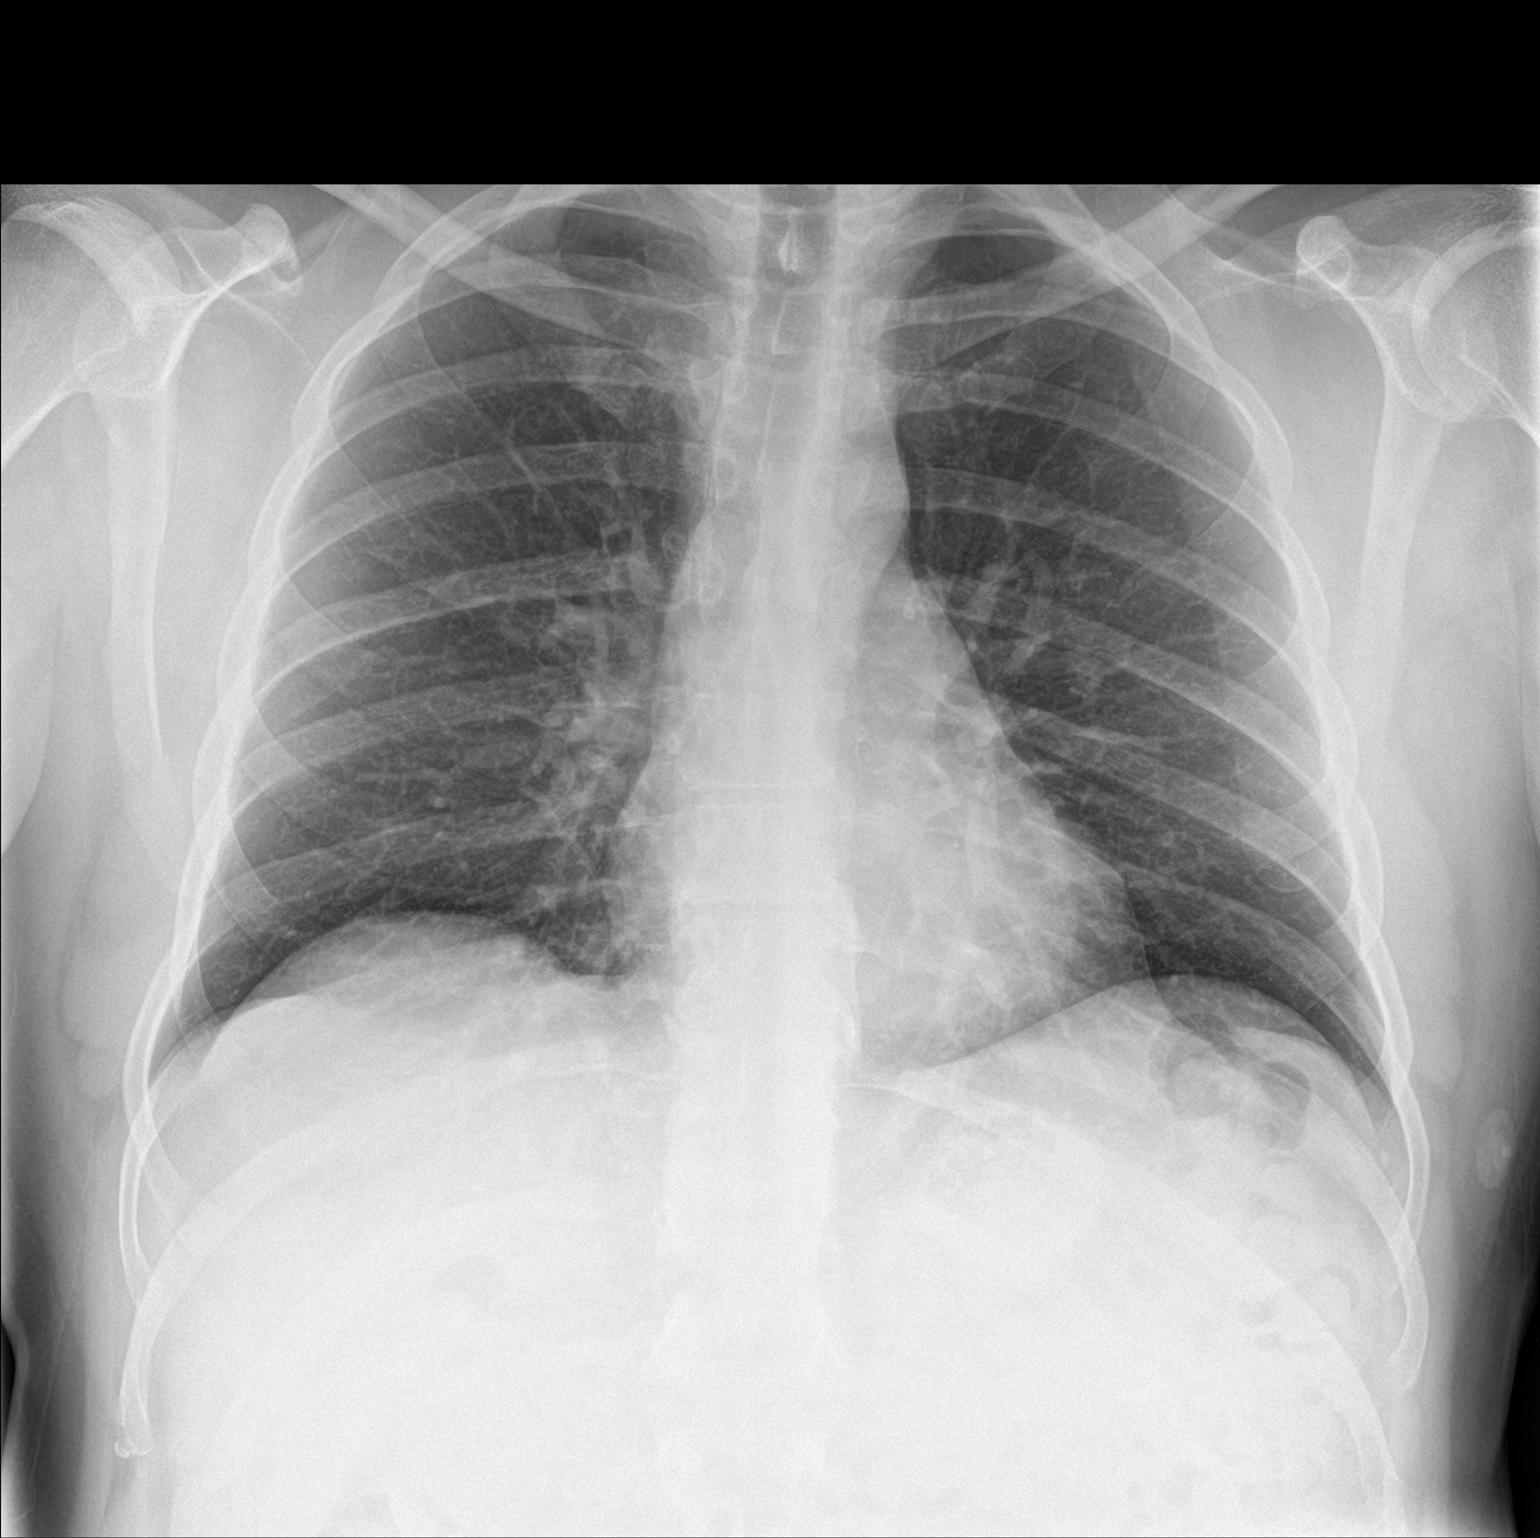

[chest lat]
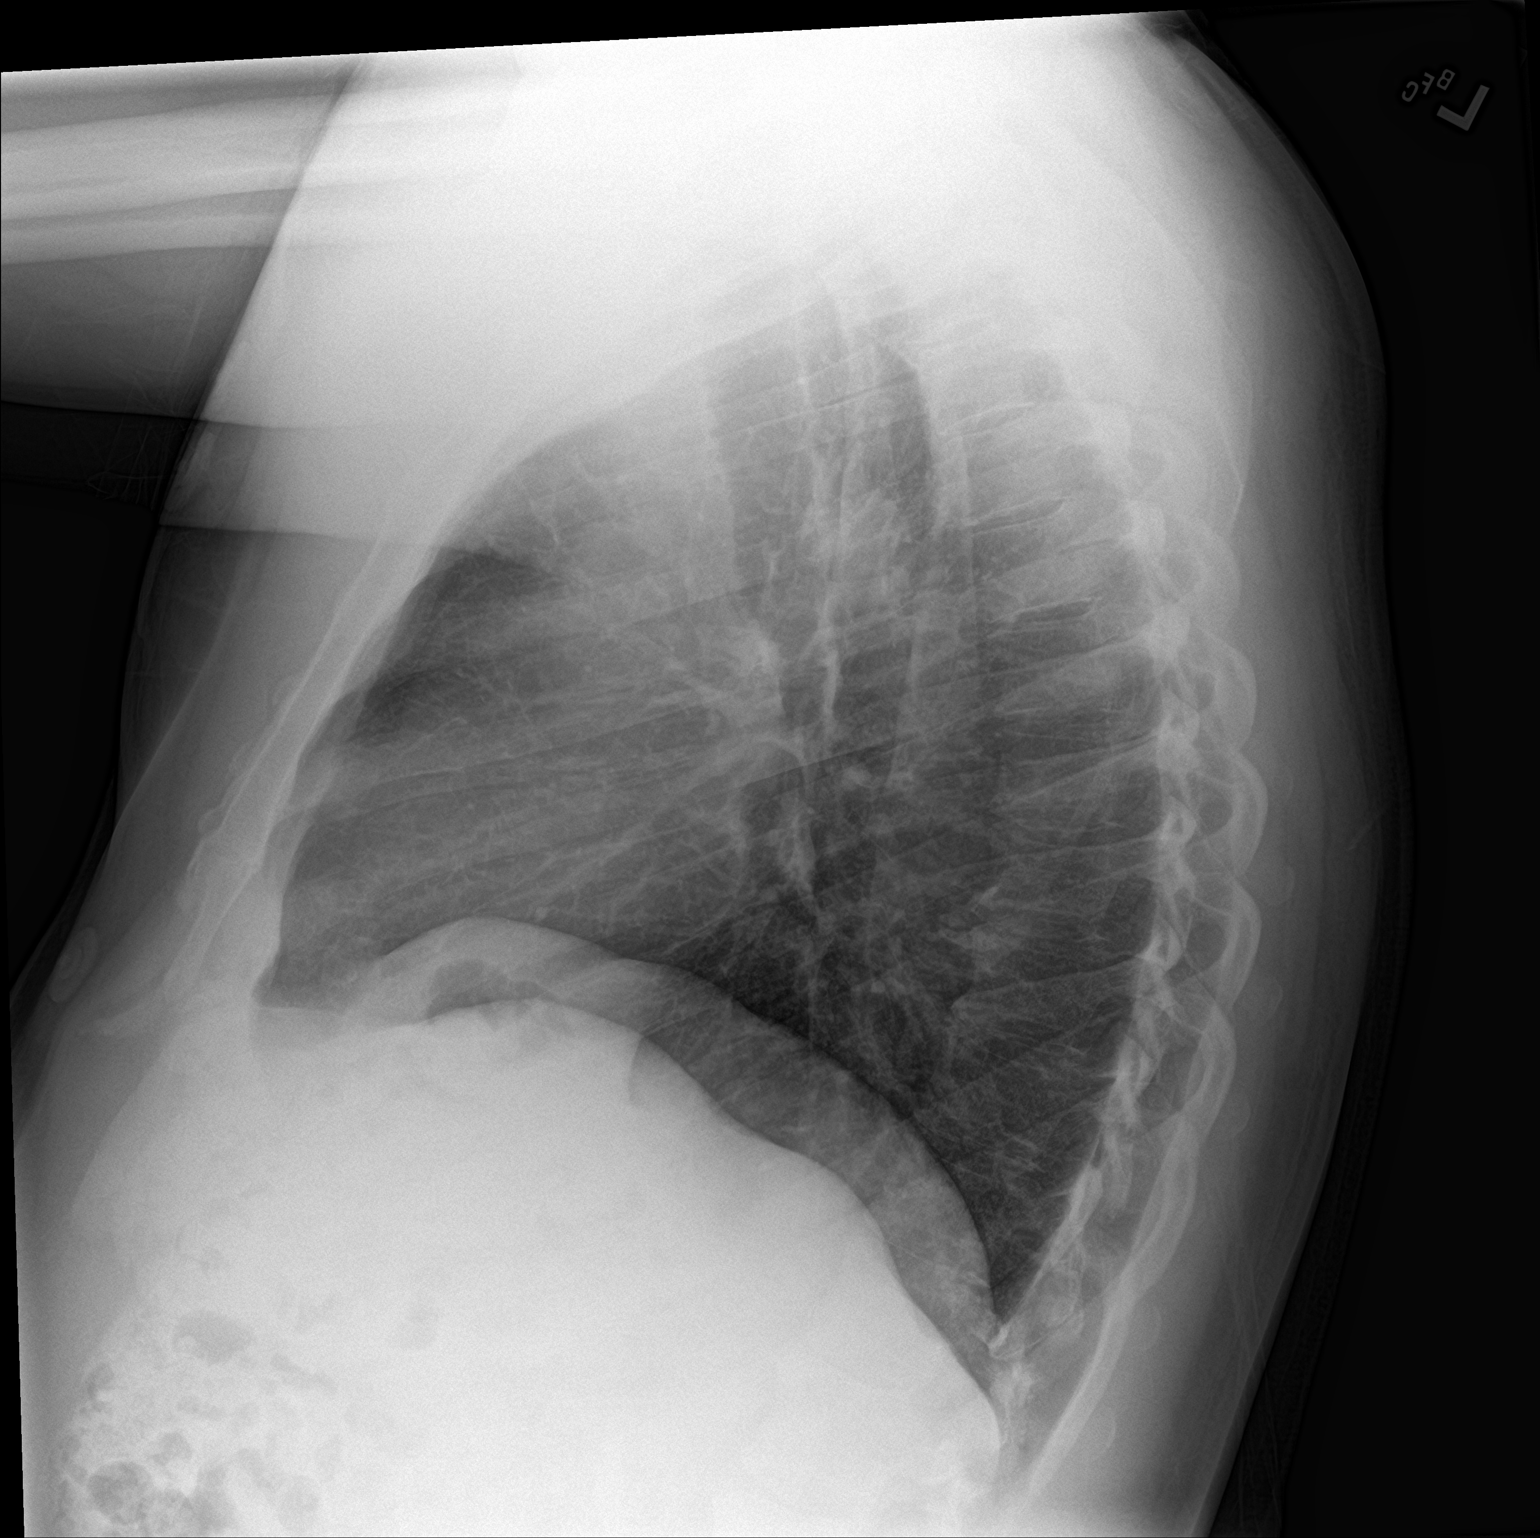

[2 of 2 positions shown; findings below may reference images not displayed]

FINDINGS: The cardiomediastinal silhouette is within normal limits. The lungs
are well inflated and clear. There is no evidence of pleural
effusion or pneumothorax. No acute osseous abnormality is
identified.
IMPRESSION: No active cardiopulmonary disease.

## 2019-12-21 ENCOUNTER — Other Ambulatory Visit: Payer: Self-pay | Admitting: Family Medicine

## 2019-12-21 DIAGNOSIS — I10 Essential (primary) hypertension: Secondary | ICD-10-CM

## 2019-12-21 NOTE — Telephone Encounter (Signed)
OV 07/05/19 RTC 1 yr

## 2020-01-10 ENCOUNTER — Other Ambulatory Visit: Payer: Self-pay | Admitting: Family Medicine

## 2020-01-10 DIAGNOSIS — I10 Essential (primary) hypertension: Secondary | ICD-10-CM

## 2020-01-10 NOTE — Telephone Encounter (Signed)
OV 07/05/19 RTC 1 yr °

## 2020-02-07 ENCOUNTER — Telehealth: Payer: Self-pay | Admitting: Family Medicine

## 2020-02-07 NOTE — Telephone Encounter (Signed)
Pt called requesting that Dr Louanne Skye put an order in for him to have his antibodies tested before getting vaccinated. Wants to be called once order is placed.

## 2020-02-14 ENCOUNTER — Telehealth: Payer: Self-pay

## 2020-02-14 NOTE — Telephone Encounter (Signed)
I do not necessarily think that Covid antibody test is a very reliable test because it also test for other types of coronavirus and as far as getting the Covid shot or not, is always his decision but he does have risk factors for coronavirus and I would recommend for him to get the vaccine and I do not think that even if he test positive for the antibodies but that is a reason to not get the vaccine based on current studies.

## 2020-02-14 NOTE — Telephone Encounter (Signed)
Lmtcb.

## 2020-02-17 NOTE — Telephone Encounter (Signed)
Multiple attempts made to contact patient.  This encounter will now be closed  

## 2020-02-21 ENCOUNTER — Telehealth: Payer: Self-pay

## 2020-02-21 NOTE — Telephone Encounter (Signed)
There is not a reliable or accepted antibody test yet.  There is a test that tests levels but we do not have a standard on how much antibodies are needed to say someone has immunity.  Recommend to go ahead and get vaccine. Arville Care, MD [facility] 02/21/2020, 1:16 PM

## 2020-02-21 NOTE — Telephone Encounter (Signed)
Pt had covid in Dec 2020. He wants an order for antibodies test. Pt wants Dettinger's option on him getting the shot after the lab results come back. Please call back

## 2020-02-21 NOTE — Telephone Encounter (Signed)
Left message informing patient of Dr. Darrol Poke recommendations. Instructed him to call back with further questions if he had any.

## 2020-05-10 ENCOUNTER — Ambulatory Visit: Payer: Commercial Managed Care - PPO

## 2020-05-31 ENCOUNTER — Ambulatory Visit: Payer: Commercial Managed Care - PPO

## 2020-06-15 ENCOUNTER — Ambulatory Visit (INDEPENDENT_AMBULATORY_CARE_PROVIDER_SITE_OTHER): Payer: Commercial Managed Care - PPO | Admitting: Nurse Practitioner

## 2020-06-15 ENCOUNTER — Encounter: Payer: Self-pay | Admitting: Nurse Practitioner

## 2020-06-15 ENCOUNTER — Other Ambulatory Visit: Payer: Self-pay

## 2020-06-15 VITALS — Temp 98.4°F | Ht 70.0 in | Wt 243.4 lb

## 2020-06-15 DIAGNOSIS — Z0289 Encounter for other administrative examinations: Secondary | ICD-10-CM

## 2020-06-15 LAB — URINALYSIS
Bilirubin, UA: NEGATIVE
Glucose, UA: NEGATIVE
Ketones, UA: NEGATIVE
Leukocytes,UA: NEGATIVE
Nitrite, UA: NEGATIVE
Protein,UA: NEGATIVE
RBC, UA: NEGATIVE
Specific Gravity, UA: 1.02 (ref 1.005–1.030)
Urobilinogen, Ur: 0.2 mg/dL (ref 0.2–1.0)
pH, UA: 6 (ref 5.0–7.5)

## 2020-06-15 NOTE — Progress Notes (Signed)
Private DOT physical- see scanned in document 

## 2020-06-19 ENCOUNTER — Other Ambulatory Visit: Payer: Self-pay | Admitting: Family Medicine

## 2020-06-19 DIAGNOSIS — I10 Essential (primary) hypertension: Secondary | ICD-10-CM

## 2020-07-05 ENCOUNTER — Other Ambulatory Visit: Payer: Self-pay | Admitting: Family Medicine

## 2020-07-05 DIAGNOSIS — I10 Essential (primary) hypertension: Secondary | ICD-10-CM

## 2020-07-24 ENCOUNTER — Encounter: Payer: Self-pay | Admitting: Family Medicine

## 2020-07-24 ENCOUNTER — Other Ambulatory Visit: Payer: Self-pay

## 2020-07-24 ENCOUNTER — Ambulatory Visit (INDEPENDENT_AMBULATORY_CARE_PROVIDER_SITE_OTHER): Payer: Commercial Managed Care - PPO | Admitting: Family Medicine

## 2020-07-24 VITALS — BP 138/87 | HR 85 | Ht 70.0 in | Wt 246.0 lb

## 2020-07-24 DIAGNOSIS — Z125 Encounter for screening for malignant neoplasm of prostate: Secondary | ICD-10-CM

## 2020-07-24 DIAGNOSIS — E785 Hyperlipidemia, unspecified: Secondary | ICD-10-CM | POA: Diagnosis not present

## 2020-07-24 DIAGNOSIS — Z23 Encounter for immunization: Secondary | ICD-10-CM

## 2020-07-24 DIAGNOSIS — I10 Essential (primary) hypertension: Secondary | ICD-10-CM

## 2020-07-24 DIAGNOSIS — M1A9XX0 Chronic gout, unspecified, without tophus (tophi): Secondary | ICD-10-CM | POA: Diagnosis not present

## 2020-07-24 MED ORDER — LISINOPRIL 40 MG PO TABS: 40.0000 mg | ORAL_TABLET | Freq: Every day | ORAL | 3 refills | Status: DC

## 2020-07-24 MED ORDER — AMLODIPINE BESYLATE 5 MG PO TABS: 5.0000 mg | ORAL_TABLET | Freq: Every day | ORAL | 3 refills | Status: DC

## 2020-07-24 NOTE — Progress Notes (Signed)
BP 138/87   Pulse 85   Ht $R'5\' 10"'co$  (1.778 m)   Wt 246 lb (111.6 kg)   SpO2 95%   BMI 35.30 kg/m    Subjective:   Patient ID: Jordan Webb, male    DOB: 12-Jul-1970, 50 y.o.   MRN: [id number]  HPI: Jordan Webb is a 50 y.o. male presenting on 07/24/2020 for Medical Management of Chronic Issues and Hypertension   HPI Hypertension Patient is currently on amlodipine and lisinopril, and their blood pressure today is 138/87. Patient denies any lightheadedness or dizziness. Patient denies headaches, blurred vision, chest pains, shortness of breath, or weakness. Denies any side effects from medication and is content with current medication.   Hyperlipidemia Patient is coming in for recheck of his hyperlipidemia. The patient is currently taking no medication currently we are monitoring and doing diet and exercise. They deny any issues with myalgias or history of liver damage from it. They deny any focal numbness or weakness or chest pain.   Gout Last attack: More than a few years ago Attacks this year: None Medication: Colchicine as needed Location of attacks: feet   Relevant past medical, surgical, family and social history reviewed and updated as indicated. Interim medical history since our last visit reviewed. Allergies and medications reviewed and updated.  Review of Systems  Constitutional: Negative for chills and fever.  Eyes: Negative for visual disturbance.  Respiratory: Negative for shortness of breath and wheezing.   Cardiovascular: Negative for chest pain and leg swelling.  Musculoskeletal: Negative for back pain and gait problem.  Skin: Negative for rash.  Neurological: Negative for dizziness, weakness and light-headedness.  All other systems reviewed and are negative.   Per HPI unless specifically indicated above   Allergies as of 07/24/2020      Reactions   Prednisone Other (See Comments)      Medication List       Accurate as of July 24, 2020  2:19  PM. If you have any questions, ask your nurse or doctor.        amLODipine 5 MG tablet Commonly known as: NORVASC Take 1 tablet (5 mg total) by mouth daily.   colchicine 0.6 MG tablet Take 1 tablet (0.6 mg total) by mouth daily.   lisinopril 40 MG tablet Commonly known as: ZESTRIL Take 1 tablet (40 mg total) by mouth daily.   VITAMIN D (ERGOCALCIFEROL) PO Take by mouth.        Objective:   BP 138/87   Pulse 85   Ht $R'5\' 10"'ZZ$  (1.778 m)   Wt 246 lb (111.6 kg)   SpO2 95%   BMI 35.30 kg/m   Wt Readings from Last 3 Encounters:  07/24/20 246 lb (111.6 kg)  06/15/20 243 lb 6.4 oz (110.4 kg)  07/05/19 233 lb (105.7 kg)    Physical Exam Vitals and nursing note reviewed.  Constitutional:      General: He is not in acute distress.    Appearance: He is well-developed. He is not diaphoretic.  Eyes:     General: No scleral icterus.       Right eye: No discharge.     Conjunctiva/sclera: Conjunctivae normal.     Pupils: Pupils are equal, round, and reactive to light.  Neck:     Thyroid: No thyromegaly.  Cardiovascular:     Rate and Rhythm: Normal rate and regular rhythm.     Heart sounds: Normal heart sounds. No murmur heard.   Pulmonary:  Effort: Pulmonary effort is normal. No respiratory distress.     Breath sounds: Normal breath sounds. No wheezing.  Musculoskeletal:        General: Normal range of motion.     Cervical back: Neck supple.  Lymphadenopathy:     Cervical: No cervical adenopathy.  Skin:    General: Skin is warm and dry.     Findings: No rash.  Neurological:     Mental Status: He is alert and oriented to person, place, and time.     Coordination: Coordination normal.  Psychiatric:        Behavior: Behavior normal.       Assessment & Plan:   Problem List Items Addressed This Visit      Cardiovascular and Mediastinum   Essential hypertension, benign - Primary   Relevant Medications   amLODipine (NORVASC) 5 MG tablet   lisinopril (ZESTRIL)  40 MG tablet   Other Relevant Orders   CBC with Differential/Platelet   CMP14+EGFR   Lipid panel   CBC with Differential/Platelet   CMP14+EGFR   Lipid panel     Other   Gout   Relevant Orders   CBC with Differential/Platelet   Uric acid   Dyslipidemia   Relevant Orders   CBC with Differential/Platelet   CMP14+EGFR   Lipid panel    Other Visit Diagnoses    Need for Tdap vaccination       Relevant Orders   Tdap vaccine greater than or equal to 7yo IM (Completed)   Prostate cancer screening       Relevant Orders   PSA, total and free     doing well, BP looks good, no change in medication.  Follow up plan: Return in about 6 months (around 01/24/2021), or if symptoms worsen or fail to improve, for physical.  Counseling provided for all of the vaccine components Orders Placed This Encounter  Procedures  . Tdap vaccine greater than or equal to 7yo IM  . CBC with Differential/Platelet  . CMP14+EGFR  . Lipid panel    Caryl Pina, MD [facility] 07/24/2020, 2:19 PM

## 2020-07-25 LAB — CMP14+EGFR
ALT: 24 IU/L (ref 0–44)
AST: 21 IU/L (ref 0–40)
Albumin/Globulin Ratio: 2 (ref 1.2–2.2)
Albumin: 4.4 g/dL (ref 4.0–5.0)
Alkaline Phosphatase: 51 IU/L (ref 44–121)
BUN/Creatinine Ratio: 12 (ref 9–20)
BUN: 13 mg/dL (ref 6–24)
Bilirubin Total: 0.3 mg/dL (ref 0.0–1.2)
CO2: 23 mmol/L (ref 20–29)
Calcium: 9.3 mg/dL (ref 8.7–10.2)
Chloride: 107 mmol/L — ABNORMAL HIGH (ref 96–106)
Creatinine, Ser: 1.11 mg/dL (ref 0.76–1.27)
Globulin, Total: 2.2 g/dL (ref 1.5–4.5)
Glucose: 91 mg/dL (ref 65–99)
Potassium: 4.6 mmol/L (ref 3.5–5.2)
Sodium: 146 mmol/L — ABNORMAL HIGH (ref 134–144)
Total Protein: 6.6 g/dL (ref 6.0–8.5)
eGFR: 81 mL/min/{1.73_m2} (ref >59)

## 2020-07-25 LAB — URIC ACID: Uric Acid: 8.8 mg/dL — ABNORMAL HIGH (ref 3.8–8.4)

## 2020-07-25 LAB — CBC WITH DIFFERENTIAL/PLATELET
Basophils Absolute: 0.1 10*3/uL (ref 0.0–0.2)
Basos: 1 %
EOS (ABSOLUTE): 0.2 10*3/uL (ref 0.0–0.4)
Eos: 3 %
Hematocrit: 42.9 % (ref 37.5–51.0)
Hemoglobin: 14.3 g/dL (ref 13.0–17.7)
Immature Grans (Abs): 0 10*3/uL (ref 0.0–0.1)
Immature Granulocytes: 0 %
Lymphocytes Absolute: 2 10*3/uL (ref 0.7–3.1)
Lymphs: 30 %
MCH: 28.4 pg (ref 26.6–33.0)
MCHC: 33.3 g/dL (ref 31.5–35.7)
MCV: 85 fL (ref 79–97)
Monocytes Absolute: 0.6 10*3/uL (ref 0.1–0.9)
Monocytes: 9 %
Neutrophils Absolute: 3.9 10*3/uL (ref 1.4–7.0)
Neutrophils: 57 %
Platelets: 278 10*3/uL (ref 150–450)
RBC: 5.04 x10E6/uL (ref 4.14–5.80)
RDW: 13.4 % (ref 11.6–15.4)
WBC: 6.8 10*3/uL (ref 3.4–10.8)

## 2020-07-25 LAB — PSA, TOTAL AND FREE
PSA, Free Pct: 34 %
PSA, Free: 0.17 ng/mL
Prostate Specific Ag, Serum: 0.5 ng/mL (ref 0.0–4.0)

## 2020-07-25 LAB — LIPID PANEL
Chol/HDL Ratio: 4.8 ratio (ref 0.0–5.0)
Cholesterol, Total: 163 mg/dL (ref 100–199)
HDL: 34 mg/dL — ABNORMAL LOW (ref >39)
LDL Chol Calc (NIH): 76 mg/dL (ref 0–99)
Triglycerides: 330 mg/dL — ABNORMAL HIGH (ref 0–149)
VLDL Cholesterol Cal: 53 mg/dL — ABNORMAL HIGH (ref 5–40)

## 2020-08-15 ENCOUNTER — Telehealth: Payer: Self-pay

## 2020-08-15 DIAGNOSIS — M109 Gout, unspecified: Secondary | ICD-10-CM

## 2020-08-16 MED ORDER — COLCHICINE 0.6 MG PO TABS: 0.6000 mg | ORAL_TABLET | Freq: Every day | ORAL | 1 refills | Status: DC

## 2020-08-16 NOTE — Telephone Encounter (Signed)
Patient aware.

## 2020-08-16 NOTE — Telephone Encounter (Signed)
I have sent in a new prescription for him to have.

## 2020-08-21 ENCOUNTER — Ambulatory Visit (INDEPENDENT_AMBULATORY_CARE_PROVIDER_SITE_OTHER): Payer: Commercial Managed Care - PPO | Admitting: Family Medicine

## 2020-08-21 ENCOUNTER — Other Ambulatory Visit: Payer: Self-pay

## 2020-08-21 ENCOUNTER — Encounter: Payer: Self-pay | Admitting: Family Medicine

## 2020-08-21 VITALS — BP 121/80 | HR 121 | Temp 97.3°F | Ht 70.0 in | Wt 238.6 lb

## 2020-08-21 DIAGNOSIS — R197 Diarrhea, unspecified: Secondary | ICD-10-CM

## 2020-08-21 NOTE — Progress Notes (Signed)
Acute Office Visit  Subjective:    Patient ID: Jordan Webb, male    DOB: [birth year], 50 y.o.   MRN: [id number]  Chief Complaint  Patient presents with  . Blood In Stools    HPI Patient is in today for blood in stools. His symptoms started with nausea, vomiting, and diarrhea 2 nights ago. Nausea and vomiting resolved yesterday. He has had frank blood in his stool yesterday and today. He did have to strain yesterday with a bowel movement. Today he had a loose BM with frank blood. There was less blood in his stool than there was yesterday. He has had small amounts of frank blood in his stool before with straining. He also reports mild lower abdominal cramping. He is able to keep down fluids and has been staying well hydrated with water and gatorade. He has had light bland foods to eat yesterday and today. Denies fever, chills, dizziness or lightheadedness. Denies dysuria or flank pain.  He has not tried any remedies other than trying to stay hydrated.   Past Medical History:  Diagnosis Date  . Gout   . Hyperlipidemia   . Hypertension     Past Surgical History:  Procedure Laterality Date  . HAND RECONSTRUCTION Right 1999    Family History  Problem Relation Age of Onset  . Hypertension Mother   . Cancer Mother        bones, kidneys  . Hypertension Father   . Cancer Father        skin cancer  . Heart disease Father        [id number] 32 years old  . Cancer Maternal Grandmother   . Heart disease Maternal Grandfather   . Cancer Paternal Grandmother   . Heart disease Paternal Grandfather   . Hypertension Brother     Social History   Socioeconomic History  . Marital status: Divorced    Spouse name: Not on file  . Number of children: Not on file  . Years of education: Not on file  . Highest education level: Not on file  Occupational History  . Not on file  Tobacco Use  . Smoking status: Never Smoker  . Smokeless tobacco: Never Used  Vaping Use  . Vaping Use: Never  used  Substance and Sexual Activity  . Alcohol use: No  . Drug use: No  . Sexual activity: Not Currently    Comment: divorced since 2012  Other Topics Concern  . Not on file  Social History Narrative  . Not on file   Social Determinants of Health   Financial Resource Strain: Not on file  Food Insecurity: Not on file  Transportation Needs: Not on file  Physical Activity: Not on file  Stress: Not on file  Social Connections: Not on file  Intimate Partner Violence: Not on file    Outpatient Medications Prior to Visit  Medication Sig Dispense Refill  . amLODipine (NORVASC) 5 MG tablet Take 1 tablet (5 mg total) by mouth daily. 90 tablet 3  . colchicine 0.6 MG tablet Take 1 tablet (0.6 mg total) by mouth daily. 30 tablet 1  . lisinopril (ZESTRIL) 40 MG tablet Take 1 tablet (40 mg total) by mouth daily. 90 tablet 3  . VITAMIN D, ERGOCALCIFEROL, PO Take by mouth.     No facility-administered medications prior to visit.    Allergies  Allergen Reactions  . Prednisone Other (See Comments)    Review of Systems As per HPI.     Objective:  Physical Exam Vitals and nursing note reviewed.  Constitutional:      General: He is not in acute distress.    Appearance: Normal appearance. He is not ill-appearing, toxic-appearing or diaphoretic.  HENT:     Head: Normocephalic and atraumatic.     Mouth/Throat:     Mouth: Mucous membranes are moist.  Cardiovascular:     Rate and Rhythm: Normal rate and regular rhythm.     Heart sounds: Normal heart sounds. No murmur heard.   Pulmonary:     Effort: Pulmonary effort is normal. No respiratory distress.     Breath sounds: Normal breath sounds.  Abdominal:     General: Bowel sounds are normal. There is no distension.     Palpations: There is no mass.     Tenderness: There is no abdominal tenderness. There is no right CVA tenderness, left CVA tenderness, guarding or rebound.  Musculoskeletal:     Right lower leg: No edema.     Left  lower leg: No edema.  Skin:    General: Skin is warm and dry.     Coloration: Skin is not jaundiced.     Findings: No rash.  Neurological:     General: No focal deficit present.     Mental Status: He is alert and oriented to person, place, and time.  Psychiatric:        Mood and Affect: Mood normal.        Behavior: Behavior normal.     BP 121/80   Pulse (!) 121   Temp (!) 97.3 F (36.3 C)   Ht 5\' 10"  (1.778 m)   Wt 238 lb 9.6 oz (108.2 kg)   SpO2 97%   BMI 34.24 kg/m  Wt Readings from Last 3 Encounters:  08/21/20 238 lb 9.6 oz (108.2 kg)  07/24/20 246 lb (111.6 kg)  06/15/20 243 lb 6.4 oz (110.4 kg)    There are no preventive care reminders to display for this patient.  There are no preventive care reminders to display for this patient.   No results found for: TSH Lab Results  Component Value Date   WBC 6.8 07/24/2020   HGB 14.3 07/24/2020   HCT 42.9 07/24/2020   MCV 85 07/24/2020   PLT 278 07/24/2020   Lab Results  Component Value Date   NA 146 (H) 07/24/2020   K 4.6 07/24/2020   CO2 23 07/24/2020   GLUCOSE 91 07/24/2020   BUN 13 07/24/2020   CREATININE 1.11 07/24/2020   BILITOT 0.3 07/24/2020   ALKPHOS 51 07/24/2020   AST 21 07/24/2020   ALT 24 07/24/2020   PROT 6.6 07/24/2020   ALBUMIN 4.4 07/24/2020   CALCIUM 9.3 07/24/2020   ANIONGAP 8 03/17/2018   Lab Results  Component Value Date   CHOL 163 07/24/2020   Lab Results  Component Value Date   HDL 34 (L) 07/24/2020   Lab Results  Component Value Date   LDLCALC 76 07/24/2020   Lab Results  Component Value Date   TRIG 330 (H) 07/24/2020   Lab Results  Component Value Date   CHOLHDL 4.8 07/24/2020   No results found for: HGBA1C     Assessment & Plan:   Jordan Webb was seen today for blood in stools.  Diagnoses and all orders for this visit:  Diarrhea, unspecified type Benign abdominal exam today. Patient will return with stool for culture. Immodium for diarrhea. Discussed oral  rehydration. Bland diet, advance as tolerated. Return to office for new  or worsening symptoms, or if symptoms persist.  -     Cdiff NAA+O+P+Stool Culture  Return if symptoms worsen or fail to improve.  The patient indicates understanding of these issues and agrees with the plan.  Gabriel Earing, FNP

## 2020-08-21 NOTE — Patient Instructions (Signed)
Diarrhea, Adult Diarrhea is frequent loose and watery bowel movements. Diarrhea can make you feel weak and cause you to become dehydrated. Dehydration can make you tired and thirsty, cause you to have a dry mouth, and decrease how often you urinate. Diarrhea typically lasts 2-3 days. However, it can last longer if it is a sign of something more serious. It is important to treat your diarrhea as told by your health care provider. Follow these instructions at home: Eating and drinking Follow these recommendations as told by your health care provider:  Take an oral rehydration solution (ORS). This is an over-the-counter medicine that helps return your body to its normal balance of nutrients and water. It is found at pharmacies and retail stores.  Drink plenty of fluids, such as water, ice chips, diluted fruit juice, and low-calorie sports drinks. You can drink milk also, if desired.  Avoid drinking fluids that contain a lot of sugar or caffeine, such as energy drinks, sports drinks, and soda.  Eat bland, easy-to-digest foods in small amounts as you are able. These foods include bananas, applesauce, rice, lean meats, toast, and crackers.  Avoid alcohol.  Avoid spicy or fatty foods.      Medicines  Take over-the-counter and prescription medicines only as told by your health care provider.  If you were prescribed an antibiotic medicine, take it as told by your health care provider. Do not stop using the antibiotic even if you start to feel better. General instructions  Wash your hands often using soap and water. If soap and water are not available, use a hand sanitizer. Others in the household should wash their hands as well. Hands should be washed: ? After using the toilet or changing a diaper. ? Before preparing, cooking, or serving food. ? While caring for a sick person or while visiting someone in a hospital.  Drink enough fluid to keep your urine pale yellow.  Rest at home while you  recover.  Watch your condition for any changes.  Take a warm bath to relieve any burning or pain from frequent diarrhea episodes.  Keep all follow-up visits as told by your health care provider. This is important.   Contact a health care provider if:  You have a fever.  Your diarrhea gets worse.  You have new symptoms.  You cannot keep fluids down.  You feel light-headed or dizzy.  You have a headache.  You have muscle cramps. Get help right away if:  You have chest pain.  You feel extremely weak or you faint.  You have bloody or black stools or stools that look like tar.  You have severe pain, cramping, or bloating in your abdomen.  You have trouble breathing or you are breathing very quickly.  Your heart is beating very quickly.  Your skin feels cold and clammy.  You feel confused.  You have signs of dehydration, such as: ? Dark urine, very little urine, or no urine. ? Cracked lips. ? Dry mouth. ? Sunken eyes. ? Sleepiness. ? Weakness. Summary  Diarrhea is frequent loose and watery bowel movements. Diarrhea can make you feel weak and cause you to become dehydrated.  Drink enough fluids to keep your urine pale yellow.  Make sure that you wash your hands after using the toilet. If soap and water are not available, use hand sanitizer.  Contact a health care provider if your diarrhea gets worse or you have new symptoms.  Get help right away if you have signs of dehydration.   This information is not intended to replace advice given to you by your health care provider. Make sure you discuss any questions you have with your health care provider. Document Revised: 09/15/2018 Document Reviewed: 10/03/2017 Elsevier Patient Education  2021 [employer] Inc[employer]  

## 2020-10-02 ENCOUNTER — Encounter: Payer: Self-pay | Admitting: Nurse Practitioner

## 2020-10-02 ENCOUNTER — Ambulatory Visit (INDEPENDENT_AMBULATORY_CARE_PROVIDER_SITE_OTHER): Payer: Commercial Managed Care - PPO | Admitting: Nurse Practitioner

## 2020-10-02 ENCOUNTER — Other Ambulatory Visit: Payer: Self-pay

## 2020-10-02 VITALS — BP 137/87 | HR 88 | Temp 97.8°F | Ht 70.0 in | Wt 243.0 lb

## 2020-10-02 DIAGNOSIS — R21 Rash and other nonspecific skin eruption: Secondary | ICD-10-CM | POA: Diagnosis not present

## 2020-10-02 MED ORDER — AQUAPHOR EX OINT: TOPICAL_OINTMENT | CUTANEOUS | 0 refills | Status: AC | PRN

## 2020-10-02 NOTE — Assessment & Plan Note (Signed)
Bilateral lower extremity foot rash.  Completed dermatology referral.  Advised patient to keep skin clean, dry, change out socks, apply Aquaphor as needed for moisturizing.  Education provided to patient with printed handouts given.

## 2020-10-02 NOTE — Patient Instructions (Signed)
Rash, Adult  A rash is a change in the color of your skin. A rash can also change the way your skin feels. There are many different conditions and factors that can cause a rash. Follow these instructions at home: The goal of treatment is to stop the itching and keep the rash from spreading. Watch for any changes in your symptoms. Let your doctor know about them. Follow these instructions to help with your condition: Medicine Take or apply over-the-counter and prescription medicines only as told by your doctor. These may include medicines:  To treat red or swollen skin (corticosteroid creams).  To treat itching.  To treat an allergy (oral antihistamines).  To treat very bad symptoms (oral corticosteroids).   Skin care  Put cool cloths (compresses) on the affected areas.  Do not scratch or rub your skin.  Avoid covering the rash. Make sure that the rash is exposed to air as much as possible. Managing itching and discomfort  Avoid hot showers or baths. These can make itching worse. A cold shower may help.  Try taking a bath with: ? Epsom salts. You can get these at your local pharmacy or grocery store. Follow the instructions on the package. ? Baking soda. Pour a small amount into the bath as told by your doctor. ? Colloidal oatmeal. You can get this at your local pharmacy or grocery store. Follow the instructions on the package.  Try putting baking soda paste onto your skin. Stir water into baking soda until it gets like a paste.  Try putting on a lotion that relieves itchiness (calamine lotion).  Keep cool and out of the sun. Sweating and being hot can make itching worse. General instructions  Rest as needed.  Drink enough fluid to keep your pee (urine) pale yellow.  Wear loose-fitting clothing.  Avoid scented soaps, detergents, and perfumes. Use gentle soaps, detergents, perfumes, and other cosmetic products.  Avoid anything that causes your rash. Keep a journal to help  track what causes your rash. Write down: ? What you eat. ? What cosmetic products you use. ? What you drink. ? What you wear. This includes jewelry.  Keep all follow-up visits as told by your doctor. This is important.   Contact a doctor if:  You sweat at night.  You lose weight.  You pee (urinate) more than normal.  You pee less than normal, or you notice that your pee is a darker color than normal.  You feel weak.  You throw up (vomit).  Your skin or the whites of your eyes look yellow (jaundice).  Your skin: ? Tingles. ? Is numb.  Your rash: ? Does not go away after a few days. ? Gets worse.  You are: ? More thirsty than normal. ? More tired than normal.  You have: ? New symptoms. ? Pain in your belly (abdomen). ? A fever. ? Watery poop (diarrhea). Get help right away if:  You have a fever and your symptoms suddenly get worse.  You start to feel mixed up (confused).  You have a very bad headache or a stiff neck.  You have very bad joint pains or stiffness.  You have jerky movements that you cannot control (seizure).  Your rash covers all or most of your body. The rash may or may not be painful.  You have blisters that: ? Are on top of the rash. ? Grow larger. ? Grow together. ? Are painful. ? Are inside your nose or mouth.  You have   a rash that: ? Looks like purple pinprick-sized spots all over your body. ? Has a "bull's eye" or looks like a target. ? Is red and painful, causes your skin to peel, and is not from being in the sun too long. Summary  A rash is a change in the color of your skin. A rash can also change the way your skin feels.  The goal of treatment is to stop the itching and keep the rash from spreading.  Take or apply over-the-counter and prescription medicines only as told by your doctor.  Contact a doctor if you have new symptoms or symptoms that get worse.  Keep all follow-up visits as told by your doctor. This is  important. This information is not intended to replace advice given to you by your health care provider. Make sure you discuss any questions you have with your health care provider. Document Revised: 08/21/2018 Document Reviewed: 12/01/2017 Elsevier Patient Education  2021 [employer]  

## 2020-10-02 NOTE — Progress Notes (Signed)
Acute Office Visit  Subjective:    Patient ID: Jordan Webb, male    DOB: 12-23-1970, 50 y.o.   MRN: [id number]  Chief Complaint  Patient presents with  . Rash    Rash This is a chronic problem. The current episode started more than 1 month ago. The problem has been gradually worsening since onset. The affected locations include the left foot and right foot. The rash is characterized by itchiness, dryness, peeling and redness. He was exposed to nothing. Past treatments include anti-itch cream. The treatment provided no relief.    Past Medical History:  Diagnosis Date  . Gout   . Hyperlipidemia   . Hypertension     Past Surgical History:  Procedure Laterality Date  . HAND RECONSTRUCTION Right 1999    Family History  Problem Relation Age of Onset  . Hypertension Mother   . Cancer Mother        bones, kidneys  . Hypertension Father   . Cancer Father        skin cancer  . Heart disease Father        [id number] 3 years old  . Cancer Maternal Grandmother   . Heart disease Maternal Grandfather   . Cancer Paternal Grandmother   . Heart disease Paternal Grandfather   . Hypertension Brother     Social History   Socioeconomic History  . Marital status: Divorced    Spouse name: Not on file  . Number of children: Not on file  . Years of education: Not on file  . Highest education level: Not on file  Occupational History  . Not on file  Tobacco Use  . Smoking status: Never Smoker  . Smokeless tobacco: Never Used  Vaping Use  . Vaping Use: Never used  Substance and Sexual Activity  . Alcohol use: No  . Drug use: No  . Sexual activity: Not Currently    Comment: divorced since 2012  Other Topics Concern  . Not on file  Social History Narrative  . Not on file   Social Determinants of Health   Financial Resource Strain: Not on file  Food Insecurity: Not on file  Transportation Needs: Not on file  Physical Activity: Not on file  Stress: Not on file  Social  Connections: Not on file  Intimate Partner Violence: Not on file    Outpatient Medications Prior to Visit  Medication Sig Dispense Refill  . amLODipine (NORVASC) 5 MG tablet Take 1 tablet (5 mg total) by mouth daily. 90 tablet 3  . colchicine 0.6 MG tablet Take 1 tablet (0.6 mg total) by mouth daily. 30 tablet 1  . lisinopril (ZESTRIL) 40 MG tablet Take 1 tablet (40 mg total) by mouth daily. 90 tablet 3  . VITAMIN D, ERGOCALCIFEROL, PO Take by mouth.     No facility-administered medications prior to visit.    Allergies  Allergen Reactions  . Prednisone Other (See Comments)    Review of Systems  Constitutional: Negative.   HENT: Negative.   Respiratory: Negative.   Cardiovascular: Negative.   Musculoskeletal: Negative.   Skin: Positive for color change and rash.  Neurological: Negative.   All other systems reviewed and are negative.      Objective:    Physical Exam Vitals and nursing note reviewed.  Constitutional:      Appearance: Normal appearance.  HENT:     Head: Normocephalic.     Nose: Nose normal.  Eyes:     Conjunctiva/sclera: Conjunctivae normal.  Cardiovascular:     Rate and Rhythm: Normal rate and regular rhythm.     Pulses: Normal pulses.     Heart sounds: Normal heart sounds.  Pulmonary:     Effort: Pulmonary effort is normal.     Breath sounds: Normal breath sounds.  Abdominal:     General: Bowel sounds are normal.  Skin:    General: Skin is dry.     Findings: Rash present.  Neurological:     Mental Status: He is alert and oriented to person, place, and time.  Psychiatric:        Behavior: Behavior normal.     BP 137/87   Pulse 88   Temp 97.8 F (36.6 C) (Temporal)   Ht $R'5\' 10"'XU$  (1.778 m)   Wt 243 lb (110.2 kg)   SpO2 98%   BMI 34.87 kg/m  Wt Readings from Last 3 Encounters:  10/02/20 243 lb (110.2 kg)  08/21/20 238 lb 9.6 oz (108.2 kg)  07/24/20 246 lb (111.6 kg)    There are no preventive care reminders to display for this  patient.  There are no preventive care reminders to display for this patient.   No results found for: TSH Lab Results  Component Value Date   WBC 6.8 07/24/2020   HGB 14.3 07/24/2020   HCT 42.9 07/24/2020   MCV 85 07/24/2020   PLT 278 07/24/2020   Lab Results  Component Value Date   NA 146 (H) 07/24/2020   K 4.6 07/24/2020   CO2 23 07/24/2020   GLUCOSE 91 07/24/2020   BUN 13 07/24/2020   CREATININE 1.11 07/24/2020   BILITOT 0.3 07/24/2020   ALKPHOS 51 07/24/2020   AST 21 07/24/2020   ALT 24 07/24/2020   PROT 6.6 07/24/2020   ALBUMIN 4.4 07/24/2020   CALCIUM 9.3 07/24/2020   ANIONGAP 8 03/17/2018   EGFR 81 07/24/2020   Lab Results  Component Value Date   CHOL 163 07/24/2020   Lab Results  Component Value Date   HDL 34 (L) 07/24/2020   Lab Results  Component Value Date   LDLCALC 76 07/24/2020   Lab Results  Component Value Date   TRIG 330 (H) 07/24/2020   Lab Results  Component Value Date   CHOLHDL 4.8 07/24/2020   No results found for: HGBA1C     Assessment & Plan:   Problem List Items Addressed This Visit      Musculoskeletal and Integument   Rash - Primary    Bilateral lower extremity foot rash.  Completed dermatology referral.  Advised patient to keep skin clean, dry, change out socks, apply Aquaphor as needed for moisturizing.  Education provided to patient with printed handouts given.      Relevant Medications   mineral oil-hydrophilic petrolatum (AQUAPHOR) ointment   Other Relevant Orders   Ambulatory referral to Dermatology       Meds ordered this encounter  Medications  . mineral oil-hydrophilic petrolatum (AQUAPHOR) ointment    Sig: Apply topically as needed for dry skin.    Dispense:  420 g    Refill:  0    Order Specific Question:   Supervising Provider    Answer:   Janora Norlander [[id number]]     Ivy Lynn, NP

## 2020-10-03 ENCOUNTER — Other Ambulatory Visit: Payer: Self-pay | Admitting: Family Medicine

## 2020-10-03 DIAGNOSIS — I10 Essential (primary) hypertension: Secondary | ICD-10-CM

## 2021-01-24 ENCOUNTER — Encounter: Payer: Self-pay | Admitting: Family Medicine

## 2021-01-24 ENCOUNTER — Ambulatory Visit (INDEPENDENT_AMBULATORY_CARE_PROVIDER_SITE_OTHER): Payer: Commercial Managed Care - PPO | Admitting: Family Medicine

## 2021-01-24 ENCOUNTER — Other Ambulatory Visit: Payer: Self-pay

## 2021-01-24 VITALS — BP 134/87 | HR 72 | Ht 70.0 in | Wt 241.0 lb

## 2021-01-24 DIAGNOSIS — Z0001 Encounter for general adult medical examination with abnormal findings: Secondary | ICD-10-CM

## 2021-01-24 DIAGNOSIS — Z0000 Encounter for general adult medical examination without abnormal findings: Secondary | ICD-10-CM

## 2021-01-24 DIAGNOSIS — E785 Hyperlipidemia, unspecified: Secondary | ICD-10-CM

## 2021-01-24 DIAGNOSIS — Z114 Encounter for screening for human immunodeficiency virus [HIV]: Secondary | ICD-10-CM

## 2021-01-24 DIAGNOSIS — M1A0710 Idiopathic chronic gout, right ankle and foot, without tophus (tophi): Secondary | ICD-10-CM

## 2021-01-24 DIAGNOSIS — Z1159 Encounter for screening for other viral diseases: Secondary | ICD-10-CM

## 2021-01-24 DIAGNOSIS — I10 Essential (primary) hypertension: Secondary | ICD-10-CM | POA: Diagnosis not present

## 2021-01-24 DIAGNOSIS — Z1211 Encounter for screening for malignant neoplasm of colon: Secondary | ICD-10-CM

## 2021-01-24 MED ORDER — COLCHICINE 0.6 MG PO TABS: 0.6000 mg | ORAL_TABLET | Freq: Every day | ORAL | 3 refills | Status: DC

## 2021-01-24 MED ORDER — LISINOPRIL 40 MG PO TABS: 40.0000 mg | ORAL_TABLET | Freq: Every day | ORAL | 3 refills | Status: DC

## 2021-01-24 NOTE — Progress Notes (Signed)
BP 134/87   Pulse 72   Ht 5\' 10"  (1.778 m)   Wt 241 lb (109.3 kg)   SpO2 97%   BMI 34.58 kg/m    Subjective:   Patient ID: , male    DOB: [birth year], 50 y.o.   MRN: [id number]  HPI: Jordan Webb is a 50 y.o. male presenting on 01/24/2021 for Medical Management of Chronic Issues and Hypertension   HPI Adult well exam Patient denies any chest pain, shortness of breath, headaches or vision issues, abdominal complaints, diarrhea, nausea, vomiting, or joint issues.   Hypertension Patient is currently on amlodipine and lisinopril, and their blood pressure today is 134/87. Patient denies any lightheadedness or dizziness. Patient denies headaches, blurred vision, chest pains, shortness of breath, or weakness. Denies any side effects from medication and is content with current medication.   Hyperlipidemia Patient is coming in for recheck of his hyperlipidemia. The patient is currently taking no medication currently we are monitoring he is doing diet. They deny any issues with myalgias or history of liver damage from it. They deny any focal numbness or weakness or chest pain.   Gout Last attack: 3 months ago Attacks this year: 1 Medication: Colchicine as needed Location of attacks: Right foot  Relevant past medical, surgical, family and social history reviewed and updated as indicated. Interim medical history since our last visit reviewed. Allergies and medications reviewed and updated.  Review of Systems  Constitutional:  Negative for chills and fever.  HENT:  Negative for ear pain and tinnitus.   Eyes:  Negative for pain and discharge.  Respiratory:  Negative for cough, shortness of breath and wheezing.   Cardiovascular:  Negative for chest pain, palpitations and leg swelling.  Gastrointestinal:  Negative for abdominal pain, blood in stool, constipation and diarrhea.  Genitourinary:  Negative for dysuria and hematuria.  Musculoskeletal:  Negative for back  pain, gait problem and myalgias.  Skin:  Negative for rash.  Neurological:  Negative for dizziness, weakness and headaches.  Psychiatric/Behavioral:  Negative for suicidal ideas.   All other systems reviewed and are negative.  Per HPI unless specifically indicated above   Allergies as of 01/24/2021       Reactions   Prednisone Other (See Comments)        Medication List        Accurate as of January 24, 2021 10:28 AM. If you have any questions, ask your nurse or doctor.          amLODipine 5 MG tablet Commonly known as: NORVASC Take 1 tablet (5 mg total) by mouth daily.   colchicine 0.6 MG tablet Take 1 tablet (0.6 mg total) by mouth daily.   lisinopril 40 MG tablet Commonly known as: ZESTRIL Take 1 tablet (40 mg total) by mouth daily.   mineral oil-hydrophilic petrolatum ointment Apply topically as needed for dry skin.   VITAMIN D (ERGOCALCIFEROL) PO Take by mouth.         Objective:   BP 134/87   Pulse 72   Ht 5\' 10"  (1.778 m)   Wt 241 lb (109.3 kg)   SpO2 97%   BMI 34.58 kg/m   Wt Readings from Last 3 Encounters:  01/24/21 241 lb (109.3 kg)  10/02/20 243 lb (110.2 kg)  08/21/20 238 lb 9.6 oz (108.2 kg)    Physical Exam Vitals reviewed.  Constitutional:      General: He is not in acute distress.    Appearance: He is  well-developed. He is not diaphoretic.  HENT:     Right Ear: External ear normal.     Left Ear: External ear normal.     Nose: Nose normal.     Mouth/Throat:     Pharynx: No oropharyngeal exudate.  Eyes:     General: No scleral icterus.       Right eye: No discharge.     Conjunctiva/sclera: Conjunctivae normal.     Pupils: Pupils are equal, round, and reactive to light.  Neck:     Thyroid: No thyromegaly.  Cardiovascular:     Rate and Rhythm: Normal rate and regular rhythm.     Heart sounds: Normal heart sounds. No murmur heard. Pulmonary:     Effort: Pulmonary effort is normal. No respiratory distress.     Breath  sounds: Normal breath sounds. No wheezing.  Abdominal:     General: Bowel sounds are normal. There is no distension.     Palpations: Abdomen is soft.     Tenderness: There is no abdominal tenderness. There is no guarding or rebound.  Musculoskeletal:        General: Normal range of motion.     Cervical back: Neck supple.  Lymphadenopathy:     Cervical: No cervical adenopathy.  Skin:    General: Skin is warm and dry.     Findings: No rash.  Neurological:     Mental Status: He is alert and oriented to person, place, and time.     Coordination: Coordination normal.  Psychiatric:        Behavior: Behavior normal.      Assessment & Plan:   Problem List Items Addressed This Visit       Cardiovascular and Mediastinum   Essential hypertension, benign   Relevant Medications   lisinopril (ZESTRIL) 40 MG tablet   Other Relevant Orders   CBC with Differential/Platelet   CMP14+EGFR     Other   Gout   Relevant Medications   colchicine 0.6 MG tablet   Dyslipidemia   Relevant Orders   Lipid panel   Other Visit Diagnoses     Well adult exam    -  Primary   Relevant Orders   CBC with Differential/Platelet   CMP14+EGFR   Lipid panel   PSA, total and free   Hepatitis C antibody   HIV Antibody (routine testing w rflx)   Cologuard   Screening for HIV without presence of risk factors       Relevant Orders   HIV Antibody (routine testing w rflx)   Need for hepatitis C screening test       Relevant Orders   Hepatitis C antibody   Colon cancer screening       Relevant Orders   Cologuard     We will send Cologuard for the patient and continue current medications, recommended activity and focus on diet.  We will check blood work and see where he is doing today.  Follow up plan: Return in about 6 months (around 07/24/2021), or if symptoms worsen or fail to improve, for Hypertension and cholesterol and gout.  Counseling provided for all of the vaccine components Orders Placed  This Encounter  Procedures   CBC with Differential/Platelet   CMP14+EGFR   Lipid panel   PSA, total and free   Hepatitis C antibody   HIV Antibody (routine testing w rflx)   [place] Manami Tutor, MD [facility] 01/24/2021, 10:28 AM

## 2021-01-25 LAB — CBC WITH DIFFERENTIAL/PLATELET
Basophils Absolute: 0.1 10*3/uL (ref 0.0–0.2)
Basos: 1 %
EOS (ABSOLUTE): 0.2 10*3/uL (ref 0.0–0.4)
Eos: 3 %
Hematocrit: 44.7 % (ref 37.5–51.0)
Hemoglobin: 15.1 g/dL (ref 13.0–17.7)
Immature Grans (Abs): 0 10*3/uL (ref 0.0–0.1)
Immature Granulocytes: 0 %
Lymphocytes Absolute: 1.9 10*3/uL (ref 0.7–3.1)
Lymphs: 28 %
MCH: 28.7 pg (ref 26.6–33.0)
MCHC: 33.8 g/dL (ref 31.5–35.7)
MCV: 85 fL (ref 79–97)
Monocytes Absolute: 0.6 10*3/uL (ref 0.1–0.9)
Monocytes: 9 %
Neutrophils Absolute: 4 10*3/uL (ref 1.4–7.0)
Neutrophils: 59 %
Platelets: 272 10*3/uL (ref 150–450)
RBC: 5.27 x10E6/uL (ref 4.14–5.80)
RDW: 13.2 % (ref 11.6–15.4)
WBC: 6.8 10*3/uL (ref 3.4–10.8)

## 2021-01-25 LAB — PSA, TOTAL AND FREE
PSA, Free Pct: 37.5 %
PSA, Free: 0.15 ng/mL
Prostate Specific Ag, Serum: 0.4 ng/mL (ref 0.0–4.0)

## 2021-01-25 LAB — CMP14+EGFR
ALT: 22 IU/L (ref 0–44)
AST: 26 IU/L (ref 0–40)
Albumin/Globulin Ratio: 1.9 (ref 1.2–2.2)
Albumin: 4.5 g/dL (ref 4.0–5.0)
Alkaline Phosphatase: 56 IU/L (ref 44–121)
BUN/Creatinine Ratio: 10 (ref 9–20)
BUN: 13 mg/dL (ref 6–24)
Bilirubin Total: 0.3 mg/dL (ref 0.0–1.2)
CO2: 21 mmol/L (ref 20–29)
Calcium: 9.9 mg/dL (ref 8.7–10.2)
Chloride: 104 mmol/L (ref 96–106)
Creatinine, Ser: 1.24 mg/dL (ref 0.76–1.27)
Globulin, Total: 2.4 g/dL (ref 1.5–4.5)
Glucose: 92 mg/dL (ref 65–99)
Potassium: 4.9 mmol/L (ref 3.5–5.2)
Sodium: 144 mmol/L (ref 134–144)
Total Protein: 6.9 g/dL (ref 6.0–8.5)
eGFR: 71 mL/min/{1.73_m2} (ref >59)

## 2021-01-25 LAB — LIPID PANEL
Chol/HDL Ratio: 4.5 ratio (ref 0.0–5.0)
Cholesterol, Total: 163 mg/dL (ref 100–199)
HDL: 36 mg/dL — ABNORMAL LOW (ref >39)
LDL Chol Calc (NIH): 84 mg/dL (ref 0–99)
Triglycerides: 257 mg/dL — ABNORMAL HIGH (ref 0–149)
VLDL Cholesterol Cal: 43 mg/dL — ABNORMAL HIGH (ref 5–40)

## 2021-01-25 LAB — HEPATITIS C ANTIBODY: Hep C Virus Ab: <0.1 s/co ratio (ref 0.0–0.9)

## 2021-01-25 LAB — HIV ANTIBODY (ROUTINE TESTING W REFLEX): HIV Screen 4th Generation wRfx: NONREACTIVE

## 2021-03-09 ENCOUNTER — Other Ambulatory Visit: Payer: Self-pay

## 2021-03-09 ENCOUNTER — Ambulatory Visit (INDEPENDENT_AMBULATORY_CARE_PROVIDER_SITE_OTHER): Payer: 59

## 2021-03-09 DIAGNOSIS — Z23 Encounter for immunization: Secondary | ICD-10-CM

## 2021-07-26 ENCOUNTER — Ambulatory Visit: Payer: Commercial Managed Care - PPO | Admitting: Family Medicine

## 2021-08-13 ENCOUNTER — Other Ambulatory Visit: Payer: Self-pay | Admitting: Family Medicine

## 2021-08-13 DIAGNOSIS — I10 Essential (primary) hypertension: Secondary | ICD-10-CM

## 2021-09-20 ENCOUNTER — Ambulatory Visit (INDEPENDENT_AMBULATORY_CARE_PROVIDER_SITE_OTHER): Payer: PRIVATE HEALTH INSURANCE | Admitting: Family Medicine

## 2021-09-20 ENCOUNTER — Encounter: Payer: Self-pay | Admitting: Family Medicine

## 2021-09-20 VITALS — BP 144/91 | HR 100 | Ht 70.0 in | Wt 251.0 lb

## 2021-09-20 DIAGNOSIS — M1A0710 Idiopathic chronic gout, right ankle and foot, without tophus (tophi): Secondary | ICD-10-CM

## 2021-09-20 DIAGNOSIS — E785 Hyperlipidemia, unspecified: Secondary | ICD-10-CM | POA: Diagnosis not present

## 2021-09-20 DIAGNOSIS — I10 Essential (primary) hypertension: Secondary | ICD-10-CM

## 2021-09-20 DIAGNOSIS — Z23 Encounter for immunization: Secondary | ICD-10-CM | POA: Diagnosis not present

## 2021-09-20 NOTE — Progress Notes (Signed)
? ?BP (!) 144/91   Pulse 100   Ht _0  (1.778 m)   Wt 251 lb (113.9 kg)   SpO2 98%   BMI 36.01 kg/m?   ? ?Subjective:  ? ?Patient ID: Jordan Webb, male    DOB: 08/09/1970, 51 y.o.   MRN: [id number] ? ?HPI: ?Jordan Webb is a 51 y.o. male presenting on 09/20/2021 for Medical Management of Chronic Issues and Hypertension ? ? ?HPI ?Hypertension ?Patient is currently on amlodipine and lisinopril, and their blood pressure today is 142/96 and recheck was 144/91. Patient denies any lightheadedness or dizziness. Patient denies headaches, blurred vision, chest pains, shortness of breath, or weakness. Denies any side effects from medication and is content with current medication.  ? ?Hyperlipidemia ?Patient is coming in for recheck of his hyperlipidemia. The patient is currently taking no medicine currently, trying diet control. They deny any issues with myalgias or history of liver damage from it. They deny any focal numbness or weakness or chest pain.  ? ?Gout ?Last attack: Few years ago, nothing recently ?Attacks this year: None ?Medication: Colchicine as needed ?Location of attacks: Feet ? ?Relevant past medical, surgical, family and social history reviewed and updated as indicated. Interim medical history since our last visit reviewed. ?Allergies and medications reviewed and updated. ? ?Review of Systems  ?Constitutional:  Negative for chills and fever.  ?HENT:  Negative for ear pain and tinnitus.   ?Respiratory:  Negative for cough, shortness of breath and wheezing.   ?Cardiovascular:  Negative for chest pain, palpitations and leg swelling.  ?Gastrointestinal:  Negative for abdominal pain, blood in stool, constipation and diarrhea.  ?Genitourinary:  Negative for dysuria and hematuria.  ?Musculoskeletal:  Negative for back pain, gait problem and myalgias.  ?Skin:  Negative for rash.  ?Neurological:  Negative for dizziness, weakness and headaches.  ?Psychiatric/Behavioral:  Negative for suicidal ideas.    ?All other systems reviewed and are negative. ? ?Per HPI unless specifically indicated above ? ? ?Allergies as of 09/20/2021   ? ?   Reactions  ? Prednisone Other (See Comments)  ? ?  ? ?  ?Medication List  ?  ? ?  ? Accurate as of Sep 20, 2021  2:34 PM. If you have any questions, ask your nurse or doctor.  ?  ?  ? ?  ? ?amLODipine 5 MG tablet ?Commonly known as: NORVASC ?TAKE ONE TABLET EVERY DAY ?  ?colchicine 0.6 MG tablet ?Take 1 tablet (0.6 mg total) by mouth daily. ?  ?lisinopril 40 MG tablet ?Commonly known as: ZESTRIL ?Take 1 tablet (40 mg total) by mouth daily. ?  ?mineral oil-hydrophilic petrolatum ointment ?Apply topically as needed for dry skin. ?  ?VITAMIN D (ERGOCALCIFEROL) PO ?Take by mouth. ?  ? ?  ? ? ? ?Objective:  ? ?BP (!) 144/91   Pulse 100   Ht _1  (1.778 m)   Wt 251 lb (113.9 kg)   SpO2 98%   BMI 36.01 kg/m?   ?Wt Readings from Last 3 Encounters:  ?09/20/21 251 lb (113.9 kg)  ?01/24/21 241 lb (109.3 kg)  ?10/02/20 243 lb (110.2 kg)  ?  ?Physical Exam ?Vitals and nursing note reviewed.  ?Constitutional:   ?   General: He is not in acute distress. ?   Appearance: He is well-developed. He is not diaphoretic.  ?Eyes:  ?   General: No scleral icterus. ?   Conjunctiva/sclera: Conjunctivae normal.  ?Neck:  ?   Thyroid: No thyromegaly.  ?Cardiovascular:  ?  Rate and Rhythm: Normal rate and regular rhythm.  ?   Heart sounds: Normal heart sounds. No murmur heard. ?Pulmonary:  ?   Effort: Pulmonary effort is normal. No respiratory distress.  ?   Breath sounds: Normal breath sounds. No wheezing.  ?Musculoskeletal:     ?   General: Normal range of motion.  ?   Cervical back: Neck supple.  ?Lymphadenopathy:  ?   Cervical: No cervical adenopathy.  ?Skin: ?   General: Skin is warm and dry.  ?   Findings: No rash.  ?Neurological:  ?   Mental Status: He is alert and oriented to person, place, and time.  ?   Coordination: Coordination normal.  ?Psychiatric:     ?   Behavior: Behavior normal.   ? ? ? ? ?Assessment & Plan:  ? ?Problem List Items Addressed This Visit   ? ?  ? Cardiovascular and Mediastinum  ? Essential hypertension, benign - Primary  ? Relevant Orders  ? CBC with Differential/Platelet  ? CMP14+EGFR  ?  ? Other  ? Gout  ? Relevant Orders  ? CBC with Differential/Platelet  ? Dyslipidemia  ? Relevant Orders  ? Lipid panel  ? ?Other Visit Diagnoses   ? ? Need for shingles vaccine      ? Relevant Orders  ? Varicella-zoster vaccine IM (Shingrix) (Completed)  ? ?  ?  ?Continue current medicine, blood pressure elevated, monitor closely over the next 2 or 3 weeks and let us know if it is running up at home, we may need to increase blood pressure medicine. ? ?Check blood work and he will call in some numbers over the next month. ?Follow up plan: ?Return in about 6 months (around 03/23/2022), or if symptoms worsen or fail to improve, for Hypertension and hyperlipidemia and physical. ? ?Counseling provided for all of the vaccine components ?Orders Placed This Encounter  ?Procedures  ? Varicella-zoster vaccine IM (Shingrix)  ? CBC with Differential/Platelet  ? CMP14+EGFR  ? Lipid panel  ? ? ?Caryl Pina, MD ?[place] ?09/20/2021, 2:34 PM ? ? ?  ?

## 2021-09-21 LAB — CBC WITH DIFFERENTIAL/PLATELET
Basophils Absolute: 0.1 10*3/uL (ref 0.0–0.2)
Basos: 1 %
EOS (ABSOLUTE): 0.2 10*3/uL (ref 0.0–0.4)
Eos: 4 %
Hematocrit: 43.1 % (ref 37.5–51.0)
Hemoglobin: 14.6 g/dL (ref 13.0–17.7)
Immature Grans (Abs): 0 10*3/uL (ref 0.0–0.1)
Immature Granulocytes: 0 %
Lymphocytes Absolute: 2 10*3/uL (ref 0.7–3.1)
Lymphs: 32 %
MCH: 29.1 pg (ref 26.6–33.0)
MCHC: 33.9 g/dL (ref 31.5–35.7)
MCV: 86 fL (ref 79–97)
Monocytes Absolute: 0.6 10*3/uL (ref 0.1–0.9)
Monocytes: 10 %
Neutrophils Absolute: 3.3 10*3/uL (ref 1.4–7.0)
Neutrophils: 53 %
Platelets: 243 10*3/uL (ref 150–450)
RBC: 5.02 x10E6/uL (ref 4.14–5.80)
RDW: 13.3 % (ref 11.6–15.4)
WBC: 6.3 10*3/uL (ref 3.4–10.8)

## 2021-09-21 LAB — CMP14+EGFR
ALT: 23 IU/L (ref 0–44)
AST: 25 IU/L (ref 0–40)
Albumin/Globulin Ratio: 1.7 (ref 1.2–2.2)
Albumin: 4.3 g/dL (ref 3.8–4.9)
Alkaline Phosphatase: 53 IU/L (ref 44–121)
BUN/Creatinine Ratio: 9 (ref 9–20)
BUN: 12 mg/dL (ref 6–24)
Bilirubin Total: 0.3 mg/dL (ref 0.0–1.2)
CO2: 26 mmol/L (ref 20–29)
Calcium: 9.6 mg/dL (ref 8.7–10.2)
Chloride: 105 mmol/L (ref 96–106)
Creatinine, Ser: 1.37 mg/dL — ABNORMAL HIGH (ref 0.76–1.27)
Globulin, Total: 2.6 g/dL (ref 1.5–4.5)
Glucose: 102 mg/dL — ABNORMAL HIGH (ref 70–99)
Potassium: 4.5 mmol/L (ref 3.5–5.2)
Sodium: 143 mmol/L (ref 134–144)
Total Protein: 6.9 g/dL (ref 6.0–8.5)
eGFR: 62 mL/min/{1.73_m2} (ref >59)

## 2021-09-21 LAB — LIPID PANEL
Chol/HDL Ratio: 5.3 ratio — ABNORMAL HIGH (ref 0.0–5.0)
Cholesterol, Total: 163 mg/dL (ref 100–199)
HDL: 31 mg/dL — ABNORMAL LOW (ref >39)
LDL Chol Calc (NIH): 71 mg/dL (ref 0–99)
Triglycerides: 384 mg/dL — ABNORMAL HIGH (ref 0–149)
VLDL Cholesterol Cal: 61 mg/dL — ABNORMAL HIGH (ref 5–40)

## 2021-11-12 ENCOUNTER — Other Ambulatory Visit: Payer: Self-pay | Admitting: Family Medicine

## 2021-11-12 DIAGNOSIS — I10 Essential (primary) hypertension: Secondary | ICD-10-CM

## 2022-02-11 ENCOUNTER — Other Ambulatory Visit: Payer: Self-pay | Admitting: Family Medicine

## 2022-02-11 DIAGNOSIS — I10 Essential (primary) hypertension: Secondary | ICD-10-CM

## 2022-02-25 ENCOUNTER — Other Ambulatory Visit: Payer: Self-pay | Admitting: Family Medicine

## 2022-02-25 DIAGNOSIS — I10 Essential (primary) hypertension: Secondary | ICD-10-CM

## 2022-03-29 ENCOUNTER — Encounter: Payer: Self-pay | Admitting: Family Medicine

## 2022-03-29 ENCOUNTER — Ambulatory Visit (INDEPENDENT_AMBULATORY_CARE_PROVIDER_SITE_OTHER): Payer: PRIVATE HEALTH INSURANCE | Admitting: Family Medicine

## 2022-03-29 VITALS — BP 150/88 | HR 103 | Temp 98.0°F | Ht 70.0 in | Wt 246.0 lb

## 2022-03-29 DIAGNOSIS — E785 Hyperlipidemia, unspecified: Secondary | ICD-10-CM

## 2022-03-29 DIAGNOSIS — I10 Essential (primary) hypertension: Secondary | ICD-10-CM | POA: Diagnosis not present

## 2022-03-29 DIAGNOSIS — Z23 Encounter for immunization: Secondary | ICD-10-CM

## 2022-03-29 DIAGNOSIS — M1A0710 Idiopathic chronic gout, right ankle and foot, without tophus (tophi): Secondary | ICD-10-CM | POA: Diagnosis not present

## 2022-03-29 DIAGNOSIS — Z125 Encounter for screening for malignant neoplasm of prostate: Secondary | ICD-10-CM

## 2022-03-29 DIAGNOSIS — Z0001 Encounter for general adult medical examination with abnormal findings: Secondary | ICD-10-CM

## 2022-03-29 DIAGNOSIS — Z0000 Encounter for general adult medical examination without abnormal findings: Secondary | ICD-10-CM

## 2022-03-29 MED ORDER — AMLODIPINE BESYLATE 5 MG PO TABS: 5.0000 mg | ORAL_TABLET | Freq: Every day | ORAL | 3 refills | Status: DC

## 2022-03-29 MED ORDER — AMLODIPINE BESYLATE 10 MG PO TABS: 10.0000 mg | ORAL_TABLET | Freq: Every day | ORAL | 3 refills | Status: DC

## 2022-03-29 MED ORDER — LISINOPRIL 40 MG PO TABS: 40.0000 mg | ORAL_TABLET | Freq: Every day | ORAL | 3 refills | Status: DC

## 2022-03-29 NOTE — Progress Notes (Signed)
BP (!) 150/88   Pulse (!) 103   Temp 98 F (36.7 C)   Ht _0  (1.778 m)   Wt 246 lb (111.6 kg)   SpO2 98%   BMI 35.30 kg/m    Subjective:   Patient ID: Jordan Webb, male    DOB: 19-Feb-1971, 51 y.o.   MRN: [id number]  HPI: Jordan Webb is a 51 y.o. male presenting on 03/29/2022 for Medical Management of Chronic Issues (CPE)   HPI Physical exam Patient denies any chest pain, shortness of breath, headaches or vision issues, abdominal complaints, diarrhea, nausea, vomiting, or joint issues.   Hypertension Patient is currently on amlodipine and lisinopril, and their blood pressure today is 150/88. Patient denies any lightheadedness or dizziness. Patient denies headaches, blurred vision, chest pains, shortness of breath, or weakness. Denies any side effects from medication and is content with current medication.   Hyperlipidemia Patient is coming in for recheck of his hyperlipidemia. The patient is currently taking no medicine currently, diet control. They deny any issues with myalgias or history of liver damage from it. They deny any focal numbness or weakness or chest pain.   Gout Last attack: A month ago Attacks this year: 2 or 3 Medication: Colchicine as needed Location of attacks: Right foot and toes  Relevant past medical, surgical, family and social history reviewed and updated as indicated. Interim medical history since our last visit reviewed. Allergies and medications reviewed and updated.  Review of Systems  Constitutional:  Negative for chills and fever.  HENT:  Negative for ear pain and tinnitus.   Eyes:  Negative for pain.  Respiratory:  Negative for cough, shortness of breath and wheezing.   Cardiovascular:  Negative for chest pain, palpitations and leg swelling.  Gastrointestinal:  Negative for abdominal pain, blood in stool, constipation and diarrhea.  Genitourinary:  Negative for dysuria and hematuria.  Musculoskeletal:  Negative for back pain,  gait problem and myalgias.  Skin:  Negative for rash.  Neurological:  Negative for dizziness, weakness and headaches.  Psychiatric/Behavioral:  Negative for suicidal ideas.   All other systems reviewed and are negative.   Per HPI unless specifically indicated above   Allergies as of 03/29/2022       Reactions   Prednisone Other (See Comments)        Medication List        Accurate as of March 29, 2022  8:50 AM. If you have any questions, ask your nurse or doctor.          amLODipine 10 MG tablet Commonly known as: NORVASC Take 1 tablet (10 mg total) by mouth daily. Disregard the 5 mg and take the 10 mg What changed:  medication strength how much to take additional instructions Changed by: Fransisca Kaufmann Ousman Dise, MD   colchicine 0.6 MG tablet Take 1 tablet (0.6 mg total) by mouth daily.   lisinopril 40 MG tablet Commonly known as: ZESTRIL Take 1 tablet (40 mg total) by mouth daily.   mineral oil-hydrophilic petrolatum ointment Apply topically as needed for dry skin.   VITAMIN D (ERGOCALCIFEROL) PO Take by mouth.         Objective:   BP (!) 150/88   Pulse (!) 103   Temp 98 F (36.7 C)   Ht _1  (1.778 m)   Wt 246 lb (111.6 kg)   SpO2 98%   BMI 35.30 kg/m   Wt Readings from Last 3 Encounters:  03/29/22 246 lb (111.6 kg)  09/20/21 251 lb (113.9 kg)  01/24/21 241 lb (109.3 kg)    Physical Exam Vitals and nursing note reviewed.  Constitutional:      General: He is not in acute distress.    Appearance: He is well-developed. He is not diaphoretic.  HENT:     Right Ear: External ear normal.     Left Ear: External ear normal.     Nose: Nose normal.     Mouth/Throat:     Pharynx: No oropharyngeal exudate.  Eyes:     General: No scleral icterus.       Right eye: No discharge.     Conjunctiva/sclera: Conjunctivae normal.     Pupils: Pupils are equal, round, and reactive to light.  Neck:     Thyroid: No thyromegaly.  Cardiovascular:      Rate and Rhythm: Normal rate and regular rhythm.     Heart sounds: Normal heart sounds. No murmur heard. Pulmonary:     Effort: Pulmonary effort is normal. No respiratory distress.     Breath sounds: Normal breath sounds. No wheezing.  Abdominal:     General: Bowel sounds are normal. There is no distension.     Palpations: Abdomen is soft.     Tenderness: There is no abdominal tenderness. There is no guarding or rebound.  Genitourinary:    Prostate: Normal.     Rectum: External hemorrhoid (Small nontender external hemorrhoid) present. No mass, tenderness or internal hemorrhoid.  Musculoskeletal:        General: Normal range of motion.     Cervical back: Neck supple.  Lymphadenopathy:     Cervical: No cervical adenopathy.  Skin:    General: Skin is warm and dry.     Findings: No rash.  Neurological:     Mental Status: He is alert and oriented to person, place, and time.     Coordination: Coordination normal.  Psychiatric:        Behavior: Behavior normal.       Assessment & Plan:   Problem List Items Addressed This Visit       Cardiovascular and Mediastinum   Essential hypertension, benign   Relevant Medications   lisinopril (ZESTRIL) 40 MG tablet   amLODipine (NORVASC) 10 MG tablet   Other Relevant Orders   CMP14+EGFR     Other   Gout   Relevant Orders   CBC with Differential/Platelet   CMP14+EGFR   Dyslipidemia   Relevant Orders   CMP14+EGFR   Lipid panel   Other Visit Diagnoses     Physical exam    -  Primary   Relevant Orders   CBC with Differential/Platelet   CMP14+EGFR   Lipid panel   PSA, total and free   Prostate cancer screening       Relevant Orders   PSA, total and free   Need for shingles vaccine           Will increase blood pressure medicine, increase amlodipine to 10 mg.  We will check the rest of blood work.  For now otherwise keep the medicine the same. Follow up plan: Return in about 6 months (around 09/27/2022), or if symptoms  worsen or fail to improve, for Hypertension and cholesterol recheck.  Counseling provided for all of the vaccine components Orders Placed This Encounter  Procedures   CBC with Differential/Platelet   CMP14+EGFR   Lipid panel   PSA, total and free    Caryl Pina, MD [facility] 03/29/2022, 8:50 AM

## 2022-03-30 LAB — CBC WITH DIFFERENTIAL/PLATELET
Basophils Absolute: 0 10*3/uL (ref 0.0–0.2)
Basos: 1 %
EOS (ABSOLUTE): 0.1 10*3/uL (ref 0.0–0.4)
Eos: 2 %
Hematocrit: 43.2 % (ref 37.5–51.0)
Hemoglobin: 14.8 g/dL (ref 13.0–17.7)
Immature Grans (Abs): 0 10*3/uL (ref 0.0–0.1)
Immature Granulocytes: 1 %
Lymphocytes Absolute: 1 10*3/uL (ref 0.7–3.1)
Lymphs: 15 %
MCH: 29 pg (ref 26.6–33.0)
MCHC: 34.3 g/dL (ref 31.5–35.7)
MCV: 85 fL (ref 79–97)
Monocytes Absolute: 0.6 10*3/uL (ref 0.1–0.9)
Monocytes: 9 %
Neutrophils Absolute: 4.8 10*3/uL (ref 1.4–7.0)
Neutrophils: 72 %
Platelets: 211 10*3/uL (ref 150–450)
RBC: 5.1 x10E6/uL (ref 4.14–5.80)
RDW: 12.6 % (ref 11.6–15.4)
WBC: 6.6 10*3/uL (ref 3.4–10.8)

## 2022-03-30 LAB — CMP14+EGFR
ALT: 26 IU/L (ref 0–44)
AST: 22 IU/L (ref 0–40)
Albumin/Globulin Ratio: 2 (ref 1.2–2.2)
Albumin: 4.3 g/dL (ref 3.8–4.9)
Alkaline Phosphatase: 57 IU/L (ref 44–121)
BUN/Creatinine Ratio: 9 (ref 9–20)
BUN: 11 mg/dL (ref 6–24)
Bilirubin Total: 0.3 mg/dL (ref 0.0–1.2)
CO2: 25 mmol/L (ref 20–29)
Calcium: 9.1 mg/dL (ref 8.7–10.2)
Chloride: 105 mmol/L (ref 96–106)
Creatinine, Ser: 1.28 mg/dL — ABNORMAL HIGH (ref 0.76–1.27)
Globulin, Total: 2.2 g/dL (ref 1.5–4.5)
Glucose: 83 mg/dL (ref 70–99)
Potassium: 4.3 mmol/L (ref 3.5–5.2)
Sodium: 143 mmol/L (ref 134–144)
Total Protein: 6.5 g/dL (ref 6.0–8.5)
eGFR: 67 mL/min/{1.73_m2} (ref >59)

## 2022-03-30 LAB — PSA, TOTAL AND FREE
PSA, Free Pct: 42.5 %
PSA, Free: 0.17 ng/mL
Prostate Specific Ag, Serum: 0.4 ng/mL (ref 0.0–4.0)

## 2022-03-30 LAB — LIPID PANEL
Chol/HDL Ratio: 3.8 ratio (ref 0.0–5.0)
Cholesterol, Total: 143 mg/dL (ref 100–199)
HDL: 38 mg/dL — ABNORMAL LOW (ref >39)
LDL Chol Calc (NIH): 70 mg/dL (ref 0–99)
Triglycerides: 208 mg/dL — ABNORMAL HIGH (ref 0–149)
VLDL Cholesterol Cal: 35 mg/dL (ref 5–40)

## 2022-05-24 ENCOUNTER — Ambulatory Visit (INDEPENDENT_AMBULATORY_CARE_PROVIDER_SITE_OTHER): Payer: PRIVATE HEALTH INSURANCE | Admitting: Nurse Practitioner

## 2022-05-24 ENCOUNTER — Encounter: Payer: Self-pay | Admitting: Nurse Practitioner

## 2022-05-24 DIAGNOSIS — Z024 Encounter for examination for driving license: Secondary | ICD-10-CM

## 2022-05-24 DIAGNOSIS — Z0289 Encounter for other administrative examinations: Secondary | ICD-10-CM

## 2022-05-24 LAB — URINALYSIS
Bilirubin, UA: NEGATIVE
Glucose, UA: NEGATIVE
Ketones, UA: NEGATIVE
Leukocytes,UA: NEGATIVE
Nitrite, UA: NEGATIVE
Protein,UA: NEGATIVE
RBC, UA: NEGATIVE
Specific Gravity, UA: 1.015 (ref 1.005–1.030)
Urobilinogen, Ur: 0.2 mg/dL (ref 0.2–1.0)
pH, UA: 6.5 (ref 5.0–7.5)

## 2022-05-24 NOTE — Progress Notes (Signed)
[employer]- see scanned in document 

## 2022-07-17 ENCOUNTER — Encounter: Payer: Self-pay | Admitting: *Deleted

## 2022-09-12 ENCOUNTER — Ambulatory Visit: Payer: PRIVATE HEALTH INSURANCE | Admitting: Family Medicine

## 2022-09-12 ENCOUNTER — Encounter: Payer: Self-pay | Admitting: Family Medicine

## 2022-09-12 VITALS — BP 128/87 | HR 92 | Ht 70.0 in | Wt 239.0 lb

## 2022-09-12 DIAGNOSIS — M1A0710 Idiopathic chronic gout, right ankle and foot, without tophus (tophi): Secondary | ICD-10-CM | POA: Diagnosis not present

## 2022-09-12 DIAGNOSIS — E785 Hyperlipidemia, unspecified: Secondary | ICD-10-CM

## 2022-09-12 DIAGNOSIS — I10 Essential (primary) hypertension: Secondary | ICD-10-CM | POA: Diagnosis not present

## 2022-09-12 DIAGNOSIS — M7042 Prepatellar bursitis, left knee: Secondary | ICD-10-CM | POA: Diagnosis not present

## 2022-09-12 MED ORDER — COLCHICINE 0.6 MG PO TABS: 0.6000 mg | ORAL_TABLET | Freq: Every day | ORAL | 2 refills | Status: DC | PRN

## 2022-09-12 NOTE — Progress Notes (Signed)
BP 128/87   Pulse 92   Ht 5\' 10"  (1.778 m)   Wt 239 lb (108.4 kg)   SpO2 95%   BMI 34.29 kg/m    Subjective:   Patient ID: Jordan Webb, male    DOB: Dec 18, 1970, 52 y.o.   MRN: [id number]  HPI: Jordan Webb is a 52 y.o. male presenting on 09/12/2022 for Medical Management of Chronic Issues and Hypertension   HPI Gout Last attack: Currently having swelling and inflammation in his left knee and thinks it is related to gout.  He has been having this for a couple days.  He has not taken his colchicine yet with this flareup because he wanted to show me what was happening.  And has had it a few times like this in the past year Attacks this year: 2 or 3 Medication: Colchicine as needed Location of attacks: Feet  Hypertension Patient is currently on amlodipine and lisinopril, and their blood pressure today is 128/87. Patient denies any lightheadedness or dizziness. Patient denies headaches, blurred vision, chest pains, shortness of breath, or weakness. Denies any side effects from medication and is content with current medication.   Hyperlipidemia Patient is coming in for recheck of his hyperlipidemia. The patient is currently taking none currently, trying diet control. They deny any issues with myalgias or history of liver damage from it. They deny any focal numbness or weakness or chest pain.   Relevant past medical, surgical, family and social history reviewed and updated as indicated. Interim medical history since our last visit reviewed. Allergies and medications reviewed and updated.  Review of Systems  Constitutional:  Negative for chills and fever.  Eyes:  Negative for visual disturbance.  Respiratory:  Negative for shortness of breath and wheezing.   Cardiovascular:  Negative for chest pain and leg swelling.  Musculoskeletal:  Positive for arthralgias and joint swelling. Negative for back pain and gait problem.  Skin:  Negative for color change, rash and wound.  All  other systems reviewed and are negative.   Per HPI unless specifically indicated above   Allergies as of 09/12/2022       Reactions   Prednisone Other (See Comments)        Medication List        Accurate as of Sep 12, 2022  8:33 AM. If you have any questions, ask your nurse or doctor.          amLODipine 10 MG tablet Commonly known as: NORVASC Take 1 tablet (10 mg total) by mouth daily. Disregard the 5 mg and take the 10 mg   colchicine 0.6 MG tablet Take 1 tablet (0.6 mg total) by mouth daily as needed.   lisinopril 40 MG tablet Commonly known as: ZESTRIL Take 1 tablet (40 mg total) by mouth daily.   mineral oil-hydrophilic petrolatum ointment Apply topically as needed for dry skin.   VITAMIN D (ERGOCALCIFEROL) PO Take by mouth.         Objective:   BP 128/87   Pulse 92   Ht 5\' 10"  (1.778 m)   Wt 239 lb (108.4 kg)   SpO2 95%   BMI 34.29 kg/m   Wt Readings from Last 3 Encounters:  09/12/22 239 lb (108.4 kg)  03/29/22 246 lb (111.6 kg)  09/20/21 251 lb (113.9 kg)    Physical Exam Vitals and nursing note reviewed.  Constitutional:      General: He is not in acute distress.    Appearance: He is  well-developed. He is not diaphoretic.  Eyes:     General: No scleral icterus.    Conjunctiva/sclera: Conjunctivae normal.  Neck:     Thyroid: No thyromegaly.  Cardiovascular:     Rate and Rhythm: Normal rate and regular rhythm.     Heart sounds: Normal heart sounds. No murmur heard. Pulmonary:     Effort: Pulmonary effort is normal. No respiratory distress.     Breath sounds: Normal breath sounds. No wheezing.  Musculoskeletal:        General: Normal range of motion.     Cervical back: Neck supple.     Left knee: Swelling present. No erythema, bony tenderness or crepitus. Normal range of motion. Tenderness (Prepatellar bursitis, no erythema) present.  Lymphadenopathy:     Cervical: No cervical adenopathy.  Skin:    General: Skin is warm and dry.      Findings: No rash.  Neurological:     Mental Status: He is alert and oriented to person, place, and time.     Coordination: Coordination normal.  Psychiatric:        Behavior: Behavior normal.       Assessment & Plan:   Problem List Items Addressed This Visit       Cardiovascular and Mediastinum   Essential hypertension, benign - Primary   Relevant Orders   CBC with Differential/Platelet   CMP14+EGFR     Other   Gout   Relevant Medications   colchicine 0.6 MG tablet   Other Relevant Orders   Uric acid   Dyslipidemia   Relevant Orders   CBC with Differential/Platelet   CMP14+EGFR   Lipid panel   Other Visit Diagnoses     Prepatellar bursitis of left knee       Relevant Orders   Uric acid       Concerned that the prepatellar bursitis is a gout flare, will test uric acid levels and he will take his colchicine.  If not improving he will let us know. Follow up plan: Return in about 6 months (around 03/15/2023), or if symptoms worsen or fail to improve, for Physical exam and recheck hypertension and cholesterol.  Counseling provided for all of the vaccine components Orders Placed This Encounter  Procedures   CBC with Differential/Platelet   CMP14+EGFR   Lipid panel   Uric acid    Arville Care, MD [place] [facility] 09/12/2022, 8:33 AM

## 2022-09-13 LAB — CBC WITH DIFFERENTIAL/PLATELET
Basophils Absolute: 0.1 10*3/uL (ref 0.0–0.2)
Basos: 1 %
EOS (ABSOLUTE): 0.2 10*3/uL (ref 0.0–0.4)
Eos: 2 %
Hematocrit: 45.5 % (ref 37.5–51.0)
Hemoglobin: 15.1 g/dL (ref 13.0–17.7)
Immature Grans (Abs): 0 10*3/uL (ref 0.0–0.1)
Immature Granulocytes: 0 %
Lymphocytes Absolute: 1.7 10*3/uL (ref 0.7–3.1)
Lymphs: 21 %
MCH: 28.6 pg (ref 26.6–33.0)
MCHC: 33.2 g/dL (ref 31.5–35.7)
MCV: 86 fL (ref 79–97)
Monocytes Absolute: 0.7 10*3/uL (ref 0.1–0.9)
Monocytes: 9 %
Neutrophils Absolute: 5.2 10*3/uL (ref 1.4–7.0)
Neutrophils: 67 %
Platelets: 261 10*3/uL (ref 150–450)
RBC: 5.28 x10E6/uL (ref 4.14–5.80)
RDW: 12.7 % (ref 11.6–15.4)
WBC: 7.8 10*3/uL (ref 3.4–10.8)

## 2022-09-13 LAB — CMP14+EGFR
ALT: 19 IU/L (ref 0–44)
AST: 20 IU/L (ref 0–40)
Albumin/Globulin Ratio: 1.8 (ref 1.2–2.2)
Albumin: 4.5 g/dL (ref 3.8–4.9)
Alkaline Phosphatase: 60 IU/L (ref 44–121)
BUN/Creatinine Ratio: 8 — ABNORMAL LOW (ref 9–20)
BUN: 11 mg/dL (ref 6–24)
Bilirubin Total: 0.4 mg/dL (ref 0.0–1.2)
CO2: 21 mmol/L (ref 20–29)
Calcium: 9.5 mg/dL (ref 8.7–10.2)
Chloride: 106 mmol/L (ref 96–106)
Creatinine, Ser: 1.31 mg/dL — ABNORMAL HIGH (ref 0.76–1.27)
Globulin, Total: 2.5 g/dL (ref 1.5–4.5)
Glucose: 95 mg/dL (ref 70–99)
Potassium: 4.9 mmol/L (ref 3.5–5.2)
Sodium: 144 mmol/L (ref 134–144)
Total Protein: 7 g/dL (ref 6.0–8.5)
eGFR: 65 mL/min/{1.73_m2} (ref >59)

## 2022-09-13 LAB — LIPID PANEL
Chol/HDL Ratio: 4.2 ratio (ref 0.0–5.0)
Cholesterol, Total: 160 mg/dL (ref 100–199)
HDL: 38 mg/dL — ABNORMAL LOW (ref >39)
LDL Chol Calc (NIH): 92 mg/dL (ref 0–99)
Triglycerides: 173 mg/dL — ABNORMAL HIGH (ref 0–149)
VLDL Cholesterol Cal: 30 mg/dL (ref 5–40)

## 2022-09-13 LAB — URIC ACID: Uric Acid: 7.8 mg/dL (ref 3.8–8.4)

## 2022-09-26 ENCOUNTER — Ambulatory Visit: Payer: PRIVATE HEALTH INSURANCE | Admitting: Family Medicine

## 2023-02-14 ENCOUNTER — Other Ambulatory Visit: Payer: Self-pay | Admitting: Family Medicine

## 2023-02-14 DIAGNOSIS — Z1211 Encounter for screening for malignant neoplasm of colon: Secondary | ICD-10-CM

## 2023-02-14 DIAGNOSIS — Z1212 Encounter for screening for malignant neoplasm of rectum: Secondary | ICD-10-CM

## 2023-03-17 ENCOUNTER — Ambulatory Visit (INDEPENDENT_AMBULATORY_CARE_PROVIDER_SITE_OTHER): Payer: 59 | Admitting: Family Medicine

## 2023-03-17 ENCOUNTER — Encounter: Payer: Self-pay | Admitting: Family Medicine

## 2023-03-17 VITALS — BP 129/85 | HR 83 | Ht 70.0 in | Wt 241.0 lb

## 2023-03-17 DIAGNOSIS — Z0001 Encounter for general adult medical examination with abnormal findings: Secondary | ICD-10-CM | POA: Diagnosis not present

## 2023-03-17 DIAGNOSIS — M1A0710 Idiopathic chronic gout, right ankle and foot, without tophus (tophi): Secondary | ICD-10-CM

## 2023-03-17 DIAGNOSIS — Z125 Encounter for screening for malignant neoplasm of prostate: Secondary | ICD-10-CM

## 2023-03-17 DIAGNOSIS — I10 Essential (primary) hypertension: Secondary | ICD-10-CM | POA: Diagnosis not present

## 2023-03-17 DIAGNOSIS — N1831 Chronic kidney disease, stage 3a: Secondary | ICD-10-CM

## 2023-03-17 DIAGNOSIS — N183 Chronic kidney disease, stage 3 unspecified: Secondary | ICD-10-CM | POA: Insufficient documentation

## 2023-03-17 DIAGNOSIS — Z0000 Encounter for general adult medical examination without abnormal findings: Secondary | ICD-10-CM

## 2023-03-17 DIAGNOSIS — E785 Hyperlipidemia, unspecified: Secondary | ICD-10-CM

## 2023-03-17 DIAGNOSIS — Z1211 Encounter for screening for malignant neoplasm of colon: Secondary | ICD-10-CM

## 2023-03-17 MED ORDER — AMLODIPINE BESYLATE 10 MG PO TABS: 10.0000 mg | ORAL_TABLET | Freq: Every day | ORAL | 3 refills | Status: DC

## 2023-03-17 MED ORDER — LISINOPRIL 40 MG PO TABS: 40.0000 mg | ORAL_TABLET | Freq: Every day | ORAL | 3 refills | Status: DC

## 2023-03-17 NOTE — Progress Notes (Signed)
BP 129/85   Pulse 83   Ht 5\' 10"  (1.778 m)   Wt 241 lb (109.3 kg)   SpO2 98%   BMI 34.58 kg/m    Subjective:   Patient ID: Jordan Webb, male    DOB: Mar 13, 1971, 51 y.o.   MRN: [id number]  HPI: [name] is a 52 y.o. male presenting on 03/17/2023 for Medical Management of Chronic Issues (CPE)   HPI Physical exam Patient denies any chest pain, shortness of breath, headaches or vision issues, abdominal complaints, diarrhea, nausea, vomiting, or joint issues.   Hypertension and CKD recheck Patient is currently on amlodipine and lisinopril, and their blood pressure today is 129/85. Patient denies any lightheadedness or dizziness. Patient denies headaches, blurred vision, chest pains, shortness of breath, or weakness. Denies any side effects from medication and is content with current medication.   Gout Last attack: 3 months ago Attacks this year: 2 or 3 Medication: Colchicine as needed and diet Location of attacks: Right foot  Hyperlipidemia Patient is coming in for recheck of his hyperlipidemia. The patient is currently taking no medicine currently, diet control. They deny any issues with myalgias or history of liver damage from it. They deny any focal numbness or weakness or chest pain.   Relevant past medical, surgical, family and social history reviewed and updated as indicated. Interim medical history since our last visit reviewed. Allergies and medications reviewed and updated.  Review of Systems  Constitutional:  Negative for chills and fever.  HENT:  Negative for ear pain and tinnitus.   Eyes:  Negative for pain and discharge.  Respiratory:  Negative for cough, shortness of breath and wheezing.   Cardiovascular:  Negative for chest pain, palpitations and leg swelling.  Gastrointestinal:  Negative for abdominal pain, blood in stool, constipation and diarrhea.  Genitourinary:  Negative for dysuria and hematuria.  Musculoskeletal:  Negative for back pain, gait  problem and myalgias.  Skin:  Negative for rash.  Neurological:  Negative for dizziness, weakness and headaches.  Psychiatric/Behavioral:  Negative for suicidal ideas.   All other systems reviewed and are negative.   Per HPI unless specifically indicated above   Allergies as of 03/17/2023       Reactions   Prednisone Other (See Comments)        Medication List        Accurate as of March 17, 2023  8:38 AM. If you have any questions, ask your nurse or doctor.          amLODipine 10 MG tablet Commonly known as: NORVASC Take 1 tablet (10 mg total) by mouth daily. Disregard the 5 mg and take the 10 mg   colchicine 0.6 MG tablet Take 1 tablet (0.6 mg total) by mouth daily as needed.   lisinopril 40 MG tablet Commonly known as: ZESTRIL Take 1 tablet (40 mg total) by mouth daily.   mineral oil-hydrophilic petrolatum ointment Apply topically as needed for dry skin.   VITAMIN D (ERGOCALCIFEROL) PO Take by mouth.         Objective:   BP 129/85   Pulse 83   Ht 5\' 10"  (1.778 m)   Wt 241 lb (109.3 kg)   SpO2 98%   BMI 34.58 kg/m   Wt Readings from Last 3 Encounters:  03/17/23 241 lb (109.3 kg)  09/12/22 239 lb (108.4 kg)  03/29/22 246 lb (111.6 kg)    Physical Exam Vitals reviewed.  Constitutional:      General: He  is not in acute distress.    Appearance: He is well-developed. He is not diaphoretic.  HENT:     Right Ear: External ear normal.     Left Ear: External ear normal.     Nose: Nose normal.     Mouth/Throat:     Pharynx: No oropharyngeal exudate.  Eyes:     General: No scleral icterus.    Conjunctiva/sclera: Conjunctivae normal.  Neck:     Thyroid: No thyromegaly.  Cardiovascular:     Rate and Rhythm: Normal rate and regular rhythm.     Heart sounds: Normal heart sounds. No murmur heard. Pulmonary:     Effort: Pulmonary effort is normal. No respiratory distress.     Breath sounds: Normal breath sounds. No wheezing.  Abdominal:      General: Bowel sounds are normal. There is no distension.     Palpations: Abdomen is soft.     Tenderness: There is no abdominal tenderness. There is no guarding or rebound.  Musculoskeletal:        General: No swelling. Normal range of motion.     Cervical back: Neck supple.  Lymphadenopathy:     Cervical: No cervical adenopathy.  Skin:    General: Skin is warm and dry.     Findings: No rash.  Neurological:     Mental Status: He is alert and oriented to person, place, and time.     Coordination: Coordination normal.  Psychiatric:        Behavior: Behavior normal.       Assessment & Plan:   Problem List Items Addressed This Visit       Cardiovascular and Mediastinum   Essential hypertension, benign   Relevant Medications   lisinopril (ZESTRIL) 40 MG tablet   amLODipine (NORVASC) 10 MG tablet     Genitourinary   CKD (chronic kidney disease), stage III (HCC)   Relevant Orders   CMP14+EGFR   CBC with Differential/Platelet     Other   Gout   Dyslipidemia   Relevant Orders   Lipid panel   Other Visit Diagnoses     Physical exam    -  Primary   Relevant Orders   CMP14+EGFR   CBC with Differential/Platelet   Lipid panel   PSA, total and free   Colon cancer screening       Relevant Orders   Cologuard   Prostate cancer screening       Relevant Orders   PSA, total and free       Will check blood work today, encouraged hydration and will do Cologuard we will have it sent to his house.  Blood pressure and everything else looks good today.  Encouraged physical activity and exercise. Follow up plan: Return in about 6 months (around 09/14/2023), or if symptoms worsen or fail to improve, for Hypertension and cholesterol and CKD recheck.  Counseling provided for all of the vaccine components Orders Placed This Encounter  Procedures   Cologuard   CMP14+EGFR   CBC with Differential/Platelet   Lipid panel   PSA, total and free    Arville Care, MD [place]  [facility] 03/17/2023, 8:38 AM

## 2023-03-18 LAB — CBC WITH DIFFERENTIAL/PLATELET
Basophils Absolute: 0 10*3/uL (ref 0.0–0.2)
Basos: 1 %
EOS (ABSOLUTE): 0.1 10*3/uL (ref 0.0–0.4)
Eos: 2 %
Hematocrit: 45.4 % (ref 37.5–51.0)
Hemoglobin: 14.6 g/dL (ref 13.0–17.7)
Immature Grans (Abs): 0 10*3/uL (ref 0.0–0.1)
Immature Granulocytes: 0 %
Lymphocytes Absolute: 1.7 10*3/uL (ref 0.7–3.1)
Lymphs: 32 %
MCH: 28.4 pg (ref 26.6–33.0)
MCHC: 32.2 g/dL (ref 31.5–35.7)
MCV: 88 fL (ref 79–97)
Monocytes Absolute: 0.4 10*3/uL (ref 0.1–0.9)
Monocytes: 8 %
Neutrophils Absolute: 3 10*3/uL (ref 1.4–7.0)
Neutrophils: 57 %
Platelets: 256 10*3/uL (ref 150–450)
RBC: 5.14 x10E6/uL (ref 4.14–5.80)
RDW: 13.1 % (ref 11.6–15.4)
WBC: 5.4 10*3/uL (ref 3.4–10.8)

## 2023-03-18 LAB — LIPID PANEL
Chol/HDL Ratio: 4.4 ratio (ref 0.0–5.0)
Cholesterol, Total: 177 mg/dL (ref 100–199)
HDL: 40 mg/dL (ref >39)
LDL Chol Calc (NIH): 108 mg/dL — ABNORMAL HIGH (ref 0–99)
Triglycerides: 165 mg/dL — ABNORMAL HIGH (ref 0–149)
VLDL Cholesterol Cal: 29 mg/dL (ref 5–40)

## 2023-03-18 LAB — CMP14+EGFR
ALT: 21 IU/L (ref 0–44)
AST: 21 [IU]/L (ref 0–40)
Albumin: 4.4 g/dL (ref 3.8–4.9)
Alkaline Phosphatase: 57 IU/L (ref 44–121)
BUN/Creatinine Ratio: 10 (ref 9–20)
BUN: 14 mg/dL (ref 6–24)
Bilirubin Total: 0.4 mg/dL (ref 0.0–1.2)
CO2: 23 mmol/L (ref 20–29)
Calcium: 9.3 mg/dL (ref 8.7–10.2)
Chloride: 106 mmol/L (ref 96–106)
Creatinine, Ser: 1.46 mg/dL — ABNORMAL HIGH (ref 0.76–1.27)
Globulin, Total: 2.5 g/dL (ref 1.5–4.5)
Glucose: 93 mg/dL (ref 70–99)
Potassium: 4.9 mmol/L (ref 3.5–5.2)
Sodium: 143 mmol/L (ref 134–144)
Total Protein: 6.9 g/dL (ref 6.0–8.5)
eGFR: 57 mL/min/{1.73_m2} — ABNORMAL LOW (ref >59)

## 2023-03-18 LAB — PSA, TOTAL AND FREE
PSA, Free Pct: 30 %
PSA, Free: 0.18 ng/mL
Prostate Specific Ag, Serum: 0.6 ng/mL (ref 0.0–4.0)

## 2023-03-19 ENCOUNTER — Other Ambulatory Visit: Payer: Self-pay

## 2023-03-19 DIAGNOSIS — N189 Chronic kidney disease, unspecified: Secondary | ICD-10-CM

## 2023-05-02 ENCOUNTER — Other Ambulatory Visit (HOSPITAL_COMMUNITY): Payer: Self-pay | Admitting: Nephrology

## 2023-05-02 DIAGNOSIS — N183 Chronic kidney disease, stage 3 unspecified: Secondary | ICD-10-CM

## 2023-05-12 ENCOUNTER — Ambulatory Visit (HOSPITAL_COMMUNITY)
Admission: RE | Admit: 2023-05-12 | Discharge: 2023-05-12 | Disposition: A | Discharge status: Home / Self Care | Payer: 59 | Source: Ambulatory Visit | Attending: Nephrology | Admitting: Nephrology

## 2023-05-12 DIAGNOSIS — N183 Chronic kidney disease, stage 3 unspecified: Secondary | ICD-10-CM | POA: Insufficient documentation

## 2023-07-29 ENCOUNTER — Ambulatory Visit: Payer: 59 | Admitting: Adult Health

## 2023-07-29 ENCOUNTER — Encounter: Payer: Self-pay | Admitting: Adult Health

## 2023-07-29 VITALS — BP 146/87 | HR 85 | Ht 70.0 in | Wt 253.2 lb

## 2023-07-29 DIAGNOSIS — I129 Hypertensive chronic kidney disease with stage 1 through stage 4 chronic kidney disease, or unspecified chronic kidney disease: Secondary | ICD-10-CM | POA: Diagnosis not present

## 2023-07-29 DIAGNOSIS — N183 Chronic kidney disease, stage 3 unspecified: Secondary | ICD-10-CM | POA: Diagnosis not present

## 2023-07-29 DIAGNOSIS — R0683 Snoring: Secondary | ICD-10-CM

## 2023-07-29 DIAGNOSIS — R400 Somnolence: Secondary | ICD-10-CM | POA: Diagnosis not present

## 2023-07-29 DIAGNOSIS — I10 Essential (primary) hypertension: Secondary | ICD-10-CM

## 2023-07-29 DIAGNOSIS — Z6836 Body mass index (BMI) 36.0-36.9, adult: Secondary | ICD-10-CM

## 2023-07-29 NOTE — Patient Instructions (Signed)
 Set up for home sleep study Work on healthy weight loss Do not drive if sleepy Use caution with sedating medications Follow-up in 6 weeks to discuss sleep study results and treatment plan

## 2023-07-29 NOTE — Progress Notes (Signed)
 @Patient  ID: Jordan Webb, male    DOB: [birth year], 53 y.o.   MRN: [id number]  Chief Complaint  Patient presents with   Consult   Discussed the use of AI scribe software for clinical note transcription with the patient, who gave verbal consent to proceed.  Referring provider: Dettinger, Elige Radon, MD  HPI: 53 year old seen for sleep consult July 29, 2023 snoring,  and daytime sleepiness Medical history significant for Hypertension and Chronic Kidney Disease   TEST/EVENTS :   07/29/2023 Sleep consult  53 year old male seen for sleep consult July 29, 2023 for snoring,  and daytime sleepiness  LASTER APPLING is a 53 year old male with chronic kidney disease who presents with snoring, and daytime sleepiness. He was referred by Dr. Wolfgang Phoenix for evaluation of potential sleep apnea in the setting of difficult to control Hypertension and Chronic Kidney Disease.   He experiences snoring, and daytime sleepiness, particularly when at home and not engaged in activities. His sleep is generally hard, but he often wakes up feeling tired despite sleeping for eight hours. He occasionally wakes up gasping for air and has been told by others that he stops breathing during sleep. No history of congestive heart failure, use of pain or sedating medications, or prior sleep studies. He denies use of sleep aids and removable dental work. No intentional napping, but he may fall asleep if inactive. No swelling in ankles, but occasional tightness in hands, possibly related to gout.  He has a history of chronic kidney disease and high blood pressure, for which he is taking Norvasc and lisinopril. Despite medication, he continues to have elevated blood pressure readings. He also has a history of gout and hypercholesterolemia, for which he takes colchicine as needed. He avoids ibuprofen or similar medications due to its potential kidney damage.  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 He does not smoke, consume alcohol, or use drugs. He has a family history of heart disease and cancer. He has a weight of 253 pounds with a BMI of 36, .  Has 1 child age 25.  No symptoms suspicious for cataplexy or sleep paralysis typically goes to bed about 10 PM.  Gets up about 5 AM.  Epworth score is 8 out of 24, see below:      07/29/2023    8:26 AM  Results of the Epworth flowsheet  Sitting and reading 2  Watching TV 2  Sitting, inactive in a public place (e.g. a theatre or a meeting) 1  As a passenger in a car for an hour without a break 0  Lying down to rest in the afternoon when circumstances permit 3  Sitting and talking to someone 0  Sitting quietly after a lunch without alcohol 0  In a car, while stopped for a few minutes in traffic 0  Total score 8         Allergies  Allergen Reactions   Prednisone Other (See Comments)    Immunization History  Administered Date(s) Administered   Hepatitis B 12/15/2008, 01/17/2009, 04/11/2009   Influenza Split 02/11/2015   Influenza,inj,Quad PF,6+ Mos 02/11/2020, 03/09/2021   Influenza-Unspecified 04/12/2013, 02/20/2023   Td 11/17/2008   Tdap 11/17/2008, 07/24/2020   Zoster Recombinant(Shingrix) 09/20/2021, 03/29/2022    Past Medical History:  Diagnosis Date   Gout    Hyperlipidemia    Hypertension   CKD   Tobacco History: Social History  Tobacco Use  Smoking Status Never  Smokeless Tobacco Never   Counseling given: Not Answered   Outpatient Medications Prior to Visit  Medication Sig Dispense Refill   amLODipine (NORVASC) 10 MG tablet Take 1 tablet (10 mg total) by mouth daily. Disregard the 5 mg and take the 10 mg 90 tablet 3   colchicine 0.6 MG tablet Take 1 tablet (0.6 mg total) by mouth daily as needed. 30 tablet 2   lisinopril (ZESTRIL) 40 MG tablet Take 1 tablet (40 mg total) by mouth daily. 90 tablet 3   mineral oil-hydrophilic petrolatum (AQUAPHOR)  ointment Apply topically as needed for dry skin. 420 g 0   VITAMIN D, ERGOCALCIFEROL, PO Take by mouth.     No facility-administered medications prior to visit.     Review of Systems:   Constitutional:   No  weight loss, night sweats,  Fevers, chills, +fatigue, or  lassitude.  HEENT:   No headaches,  Difficulty swallowing,  Tooth/dental problems, or  Sore throat,                No sneezing, itching, ear ache, nasal congestion, post nasal drip,   CV:  No chest pain,  Orthopnea, PND, swelling in lower extremities, anasarca, dizziness, palpitations, syncope.   GI  No heartburn, indigestion, abdominal pain, nausea, vomiting, diarrhea, change in bowel habits, loss of appetite, bloody stools.   Resp: No shortness of breath with exertion or at rest.  No excess mucus, no productive cough,  No non-productive cough,  No coughing up of blood.  No change in color of mucus.  No wheezing.  No chest wall deformity  Skin: no rash or lesions.  GU: no dysuria, change in color of urine, no urgency or frequency.  No flank pain, no hematuria   MS:  No joint pain or swelling.  No decreased range of motion.  No back pain.    Physical Exam  BP (!) 146/87   Pulse 85   Ht 5\' 10"  (1.778 m)   Wt 253 lb 3.2 oz (114.9 kg)   SpO2 96% Comment: room air  BMI 36.33 kg/m   GEN: A/Ox3; pleasant , NAD, well nourished    HEENT:  [place]/AT,   NOSE-clear, THROAT-clear, no lesions, no postnasal drip or exudate noted.  Class III-IV MP airway  NECK:  Supple w/ fair ROM; no JVD; normal carotid impulses w/o bruits; no thyromegaly or nodules palpated; no lymphadenopathy.    RESP  Clear  P & A; w/o, wheezes/ rales/ or rhonchi. no accessory muscle use, no dullness to percussion  CARD:  RRR, no m/r/g, no peripheral edema, pulses intact, no cyanosis or clubbing.  GI:   Soft & nt; nml bowel sounds; no organomegaly or masses detected.   Musco: Warm bil, no deformities or joint swelling noted.   Neuro: alert, no focal  deficits noted.    Skin: Warm, no lesions or rashes    Lab Results:  CBC    Component Value Date/Time   WBC 5.4 03/17/2023 0844   WBC 7.4 03/17/2018 0105   RBC 5.14 03/17/2023 0844   RBC 5.35 03/17/2018 0105   HGB 14.6 03/17/2023 0844   HCT 45.4 03/17/2023 0844   PLT 256 03/17/2023 0844   MCV 88 03/17/2023 0844   MCH 28.4 03/17/2023 0844   MCH 27.9 03/17/2018 0105   MCHC 32.2 03/17/2023 0844   MCHC 32.5 03/17/2018 0105   RDW 13.1 03/17/2023 0844   LYMPHSABS 1.7 03/17/2023 0844   EOSABS  0.1 03/17/2023 0844   BASOSABS 0.0 03/17/2023 0844    BMET    Component Value Date/Time   NA 143 03/17/2023 0844   K 4.9 03/17/2023 0844   CL 106 03/17/2023 0844   CO2 23 03/17/2023 0844   GLUCOSE 93 03/17/2023 0844   GLUCOSE 106 (H) 03/17/2018 0105   BUN 14 03/17/2023 0844   CREATININE 1.46 (H) 03/17/2023 0844   CALCIUM 9.3 03/17/2023 0844   GFRNONAA 74 04/01/2018 1141   GFRAA 86 04/01/2018 1141    BNP No results found for: "BNP"  ProBNP No results found for: "PROBNP"  Imaging: No results found.  Administration History     None           No data to display          No results found for: "NITRICOXIDE"      Assessment & Plan:   Assessment and Plan    Suspected Obstructive Sleep Apnea (OSA)   He presents with symptoms indicative of OSA, such as snoring, daytime sleepiness, and gasping during sleep. His BMI of 36 is a risk factor. These symptoms, along with hypertension and chronic kidney disease, suggest OSA as a contributing factor. Untreated OSA can lead to complications like hypertension and kidney damage due to intermittent hypoxia.  A home sleep study will confirm the diagnosis. Treatment options depend on severity and include dental appliances, CPAP, and surgery. Weight loss is advised for all severities. Order a home sleep study through [facility]. Instruct on using the device, including wearing a chest belt, finger port, and nasal cannula.  Advise returning the device after use for data analysis. Schedule a follow-up in six weeks to discuss results and treatment options if OSA is confirmed.  Hypertension   Hypertension is managed with Norvasc and lisinopril, but blood pressure remains elevated, possibly due to undiagnosed sleep apnea.  Monitor blood pressure and adjust antihypertensive therapy as needed based on sleep study results and further evaluation.  Chronic Kidney Disease (CKD)   Chronic kidney disease may be linked to undiagnosed sleep apnea.  Appreciate referral from nephrology.  Continue follow-up with nephrologist Dr. Wolfgang Phoenix on CKD management   Hyperlipidemia   Hyperlipidemia is a cardiovascular risk factor, relevant to managing hypertension and potential sleep apnea. Continue current management of hyperlipidemia.  Gout   Gout is managed with colchicine as needed. It is not currently active but remains a relevant part of his medical history. Continue colchicine as needed for gout flares.      Morbid obesity with a BMI at 36.  Continue with healthy weight loss.   Rubye Oaks, NP 07/29/2023

## 2023-08-12 ENCOUNTER — Encounter

## 2023-08-12 DIAGNOSIS — R400 Somnolence: Secondary | ICD-10-CM

## 2023-08-12 DIAGNOSIS — R0683 Snoring: Secondary | ICD-10-CM

## 2023-09-04 DIAGNOSIS — R069 Unspecified abnormalities of breathing: Secondary | ICD-10-CM | POA: Diagnosis not present

## 2023-09-12 ENCOUNTER — Encounter: Payer: Self-pay | Admitting: Family Medicine

## 2023-09-12 ENCOUNTER — Ambulatory Visit: Payer: 59 | Admitting: Family Medicine

## 2023-09-12 VITALS — BP 134/89 | HR 91 | Ht 70.0 in | Wt 247.0 lb

## 2023-09-12 DIAGNOSIS — I10 Essential (primary) hypertension: Secondary | ICD-10-CM | POA: Diagnosis not present

## 2023-09-12 DIAGNOSIS — E785 Hyperlipidemia, unspecified: Secondary | ICD-10-CM | POA: Diagnosis not present

## 2023-09-12 DIAGNOSIS — Z1211 Encounter for screening for malignant neoplasm of colon: Secondary | ICD-10-CM

## 2023-09-12 DIAGNOSIS — N183 Chronic kidney disease, stage 3 unspecified: Secondary | ICD-10-CM | POA: Diagnosis not present

## 2023-09-12 LAB — LIPID PANEL

## 2023-09-12 NOTE — Progress Notes (Signed)
 BP 134/89   Pulse 91   Ht 5\' 10"  (1.778 m)   Wt 247 lb (112 kg)   SpO2 95%   BMI 35.44 kg/m    Subjective:   Patient ID: Amparo [place], male    DOB: December 30, 1970, 53 y.o.   MRN: [id number]  HPI: YISSOCHOR DIBBEN is a 53 y.o. male presenting on 09/12/2023 for Medical Management of Chronic Issues, Hyperlipidemia, and Hypertension   HPI Hypertension Patient is currently on lisinopril , and their blood pressure today is 134/89. Patient denies any lightheadedness or dizziness. Patient denies headaches, blurred vision, chest pains, shortness of breath, or weakness. Denies any side effects from medication and is content with current medication.   Hyperlipidemia Patient is coming in for recheck of his hyperlipidemia. The patient is currently taking no medicine currently, trying for diet control.. They deny any issues with myalgias or history of liver damage from it. They deny any focal numbness or weakness or chest pain.   Patient has EKG nephrology and is currently on Farxiga for that.  Relevant past medical, surgical, family and social history reviewed and updated as indicated. Interim medical history since our last visit reviewed. Allergies and medications reviewed and updated.  Review of Systems  Constitutional:  Negative for chills and fever.  Eyes:  Negative for visual disturbance.  Respiratory:  Negative for shortness of breath and wheezing.   Cardiovascular:  Negative for chest pain and leg swelling.  Musculoskeletal:  Negative for back pain and gait problem.  Skin:  Negative for rash.  Neurological:  Negative for dizziness and light-headedness.  All other systems reviewed and are negative.   Per HPI unless specifically indicated above   Allergies as of 09/12/2023       Reactions   Prednisone Other (See Comments)        Medication List        Accurate as of Sep 12, 2023  8:11 AM. If you have any questions, ask your nurse or doctor.          amLODipine  10 MG  tablet Commonly known as: NORVASC  Take 1 tablet (10 mg total) by mouth daily. Disregard the 5 mg and take the 10 mg   colchicine  0.6 MG tablet Take 1 tablet (0.6 mg total) by mouth daily as needed.   Farxiga 10 MG Tabs tablet Generic drug: dapagliflozin propanediol Take 10 mg by mouth daily.   lisinopril  40 MG tablet Commonly known as: ZESTRIL  Take 1 tablet (40 mg total) by mouth daily.   mineral oil-hydrophilic petrolatum ointment Apply topically as needed for dry skin.   VITAMIN D (ERGOCALCIFEROL) PO Take by mouth.         Objective:   BP 134/89   Pulse 91   Ht 5\' 10"  (1.778 m)   Wt 247 lb (112 kg)   SpO2 95%   BMI 35.44 kg/m   Wt Readings from Last 3 Encounters:  09/12/23 247 lb (112 kg)  07/29/23 253 lb 3.2 oz (114.9 kg)  03/17/23 241 lb (109.3 kg)    Physical Exam Vitals and nursing note reviewed.  Constitutional:      General: He is not in acute distress.    Appearance: He is well-developed. He is not diaphoretic.  Eyes:     General: No scleral icterus.    Conjunctiva/sclera: Conjunctivae normal.  Neck:     Thyroid: No thyromegaly.  Cardiovascular:     Rate and Rhythm: Normal rate and regular rhythm.  Heart sounds: Normal heart sounds. No murmur heard. Pulmonary:     Effort: Pulmonary effort is normal. No respiratory distress.     Breath sounds: Normal breath sounds. No wheezing.  Musculoskeletal:        General: No swelling. Normal range of motion.     Cervical back: Neck supple.  Lymphadenopathy:     Cervical: No cervical adenopathy.  Skin:    General: Skin is warm and dry.     Findings: No rash.  Neurological:     Mental Status: He is alert and oriented to person, place, and time.     Coordination: Coordination normal.  Psychiatric:        Behavior: Behavior normal.       Assessment & Plan:   Problem List Items Addressed This Visit       Cardiovascular and Mediastinum   Essential hypertension, benign - Primary   Relevant  Orders   CBC with Differential/Platelet   CMP14+EGFR     Genitourinary   CKD (chronic kidney disease), stage III (HCC)   Relevant Orders   CBC with Differential/Platelet     Other   Dyslipidemia   Relevant Orders   Lipid panel   Other Visit Diagnoses       Colon cancer screening       Relevant Orders   Cologuard       Will check blood work today.  Blood pressure looks good, seems to be doing well on current medicine.  Will also order Cologuard today. Follow up plan: Return in about 6 months (around 03/14/2024), or if symptoms worsen or fail to improve, for Physical exam and hypertension.  Counseling provided for all of the vaccine components Orders Placed This Encounter  Procedures   CBC with Differential/Platelet   CMP14+EGFR   Lipid panel   Cologuard    Jolyne Needs, MD [facility] 09/12/2023, 8:11 AM

## 2023-09-13 LAB — CBC WITH DIFFERENTIAL/PLATELET
Basophils Absolute: 0.1 10*3/uL (ref 0.0–0.2)
Basos: 1 %
EOS (ABSOLUTE): 0.2 10*3/uL (ref 0.0–0.4)
Eos: 3 %
Hematocrit: 45.9 % (ref 37.5–51.0)
Hemoglobin: 15.4 g/dL (ref 13.0–17.7)
Immature Grans (Abs): 0 10*3/uL (ref 0.0–0.1)
Immature Granulocytes: 0 %
Lymphocytes Absolute: 1.7 10*3/uL (ref 0.7–3.1)
Lymphs: 33 %
MCH: 29.3 pg (ref 26.6–33.0)
MCHC: 33.6 g/dL (ref 31.5–35.7)
MCV: 87 fL (ref 79–97)
Monocytes Absolute: 0.5 10*3/uL (ref 0.1–0.9)
Monocytes: 10 %
Neutrophils Absolute: 2.7 10*3/uL (ref 1.4–7.0)
Neutrophils: 53 %
Platelets: 259 10*3/uL (ref 150–450)
RBC: 5.25 x10E6/uL (ref 4.14–5.80)
RDW: 13.2 % (ref 11.6–15.4)
WBC: 5.1 10*3/uL (ref 3.4–10.8)

## 2023-09-13 LAB — LIPID PANEL
Cholesterol, Total: 151 mg/dL (ref 100–199)
HDL: 36 mg/dL — ABNORMAL LOW (ref >39)
LDL CALC COMMENT:: 4.2 ratio (ref 0.0–5.0)
LDL Chol Calc (NIH): 82 mg/dL (ref 0–99)
Triglycerides: 194 mg/dL — ABNORMAL HIGH (ref 0–149)
VLDL Cholesterol Cal: 33 mg/dL (ref 5–40)

## 2023-09-13 LAB — CMP14+EGFR
ALT: 22 IU/L (ref 0–44)
AST: 24 IU/L (ref 0–40)
Albumin: 4.5 g/dL (ref 3.8–4.9)
Alkaline Phosphatase: 60 IU/L (ref 44–121)
BUN/Creatinine Ratio: 9 (ref 9–20)
BUN: 12 mg/dL (ref 6–24)
Bilirubin Total: 0.4 mg/dL (ref 0.0–1.2)
CO2: 22 mmol/L (ref 20–29)
Calcium: 9.8 mg/dL (ref 8.7–10.2)
Chloride: 106 mmol/L (ref 96–106)
Creatinine, Ser: 1.36 mg/dL — ABNORMAL HIGH (ref 0.76–1.27)
Globulin, Total: 2.1 g/dL (ref 1.5–4.5)
Glucose: 89 mg/dL (ref 70–99)
Potassium: 4.8 mmol/L (ref 3.5–5.2)
Sodium: 144 mmol/L (ref 134–144)
Total Protein: 6.6 g/dL (ref 6.0–8.5)
eGFR: 62 mL/min/{1.73_m2} (ref >59)

## 2023-09-18 ENCOUNTER — Encounter: Payer: Self-pay | Admitting: Family Medicine

## 2023-09-26 ENCOUNTER — Ambulatory Visit: Admitting: Adult Health

## 2023-09-26 ENCOUNTER — Encounter: Payer: Self-pay | Admitting: Adult Health

## 2023-09-26 VITALS — BP 143/87 | HR 100 | Ht 70.0 in | Wt 251.0 lb

## 2023-09-26 DIAGNOSIS — N183 Chronic kidney disease, stage 3 unspecified: Secondary | ICD-10-CM

## 2023-09-26 DIAGNOSIS — G4733 Obstructive sleep apnea (adult) (pediatric): Secondary | ICD-10-CM | POA: Diagnosis not present

## 2023-09-26 DIAGNOSIS — Z6836 Body mass index (BMI) 36.0-36.9, adult: Secondary | ICD-10-CM | POA: Insufficient documentation

## 2023-09-26 DIAGNOSIS — I129 Hypertensive chronic kidney disease with stage 1 through stage 4 chronic kidney disease, or unspecified chronic kidney disease: Secondary | ICD-10-CM | POA: Diagnosis not present

## 2023-09-26 DIAGNOSIS — I10 Essential (primary) hypertension: Secondary | ICD-10-CM

## 2023-09-26 NOTE — Assessment & Plan Note (Signed)
 Continue to work on healthy weight loss

## 2023-09-26 NOTE — Assessment & Plan Note (Signed)
 Severe OSA -discussed sleep study results .  Patient education on sleep apnea and treatment options.  Patient will proceed with CPAP therapy.  Begin auto CPAP 5 to 15 cm H2O.  - discussed how weight can impact sleep and risk for sleep disordered breathing - discussed options to assist with weight loss: combination of diet modification, cardiovascular and strength training exercises   - had an extensive discussion regarding the adverse health consequences related to untreated sleep disordered breathing - specifically discussed the risks for hypertension, coronary artery disease, cardiac dysrhythmias, cerebrovascular disease, and diabetes - lifestyle modification discussed   - discussed how sleep disruption can increase risk of accidents, particularly when driving - safe driving practices were discussed   Plan  Patient Instructions  Begin CPAP therapy at bedtime, wear all night long for at least 6 or more hours Work on healthy weight loss Use caution with sedating medications Do not drive if sleepy Follow-up in 3 months and as needed

## 2023-09-26 NOTE — Patient Instructions (Signed)
 Begin CPAP therapy at bedtime, wear all night long for at least 6 or more hours Work on healthy weight loss Use caution with sedating medications Do not drive if sleepy Follow-up in 3 months and as needed

## 2023-09-26 NOTE — Progress Notes (Signed)
 @Patient  ID: Jordan Webb, male    DOB: 07-24-1970, 53 y.o.   MRN: [id number]  Chief Complaint  Patient presents with   Sleep Apnea    Referring provider: Dettinger, Lucio Sabin, MD  HPI: 53 year old male seen for sleep consult March 2025 for snoring and daytime sleepiness found to have severe obstructive sleep apnea Medical history significant for hypertension and chronic kidney disease  SH: Works for [employer], [place] License   TEST/EVENTS :  Home sleep study August 12, 2023 showed severe sleep apnea, AHI 58.8/hour (11% central), SpO2 low 70%.  09/26/2023 Follow up : OSA  Patient presents for a 6-week follow-up.  Patient was seen last visit for sleep consult.  Kindly referred by nephrology.  Patient is followed for chronic kidney disease.  Patient was experiencing snoring and daytime sleepiness.  He was set up for a home sleep study on August 12, 2023 that showed severe sleep apnea with AHI 58.8/hour, 11% centrals. Sleep study results in detail.  Discussed treatment options.  Patient will proceed with CPAP therapy.  Allergies  Allergen Reactions   Prednisone Other (See Comments)    Immunization History  Administered Date(s) Administered   Hepatitis B 12/15/2008, 01/17/2009, 04/11/2009   Influenza Split 02/11/2015   Influenza,inj,Quad PF,6+ Mos 02/11/2020, 03/09/2021   Influenza-Unspecified 04/12/2013, 02/20/2023   Td 11/17/2008   Tdap 11/17/2008, 07/24/2020   Zoster Recombinant(Shingrix ) 09/20/2021, 03/29/2022    Past Medical History:  Diagnosis Date   Gout    Hyperlipidemia    Hypertension     Tobacco History: Social History   Tobacco Use  Smoking Status Never  Smokeless Tobacco Never   Counseling given: Not Answered   Outpatient Medications Prior to Visit  Medication Sig Dispense Refill   amLODipine  (NORVASC ) 10 MG tablet Take 1 tablet (10 mg total) by mouth daily. Disregard the 5 mg and take the 10 mg 90 tablet 3   colchicine  0.6 MG tablet Take 1  tablet (0.6 mg total) by mouth daily as needed. 30 tablet 2   FARXIGA 10 MG TABS tablet Take 10 mg by mouth daily.     halobetasol (ULTRAVATE) 0.05 % cream Apply topically 2 (two) times daily as needed.     lisinopril  (ZESTRIL ) 40 MG tablet Take 1 tablet (40 mg total) by mouth daily. 90 tablet 3   mineral oil-hydrophilic petrolatum (AQUAPHOR) ointment Apply topically as needed for dry skin. 420 g 0   VITAMIN D, ERGOCALCIFEROL, PO Take by mouth.     No facility-administered medications prior to visit.     Review of Systems:   Constitutional:   No  weight loss, night sweats,  Fevers, chills, +fatigue, or  lassitude.  HEENT:   No headaches,  Difficulty swallowing,  Tooth/dental problems, or  Sore throat,                No sneezing, itching, ear ache, nasal congestion, post nasal drip,   CV:  No chest pain,  Orthopnea, PND, swelling in lower extremities, anasarca, dizziness, palpitations, syncope.   GI  No heartburn, indigestion, abdominal pain, nausea, vomiting, diarrhea, change in bowel habits, loss of appetite, bloody stools.   Resp: No shortness of breath with exertion or at rest.  No excess mucus, no productive cough,  No non-productive cough,  No coughing up of blood.  No change in color of mucus.  No wheezing.  No chest wall deformity  Skin: no rash or lesions.  GU: no dysuria, change in color of urine,  no urgency or frequency.  No flank pain, no hematuria   MS:  No joint pain or swelling.  No decreased range of motion.  No back pain.    Physical Exam  BP (!) 143/87 (BP Location: Left Arm)   Pulse 100   Ht 5\' 10"  (1.778 m)   Wt 251 lb (113.9 kg)   SpO2 94% Comment: RA  BMI 36.01 kg/m   GEN: A/Ox3; pleasant , NAD, well nourished    HEENT:  [place]/AT,   NOSE-clear, THROAT-clear, no lesions, no postnasal drip or exudate noted. Class 3-4 MP airway   NECK:  Supple w/ fair ROM; no JVD; normal carotid impulses w/o bruits; no thyromegaly or nodules palpated; no lymphadenopathy.     RESP  Clear  P & A; w/o, wheezes/ rales/ or rhonchi. no accessory muscle use, no dullness to percussion  CARD:  RRR, no m/r/g, no peripheral edema, pulses intact, no cyanosis or clubbing.  GI:   Soft & nt; nml bowel sounds; no organomegaly or masses detected.   Musco: Warm bil, no deformities or joint swelling noted.   Neuro: alert, no focal deficits noted.    Skin: Warm, no lesions or rashes    Lab Results:  CBC    Component Value Date/Time   WBC 5.1 09/12/2023 0816   WBC 7.4 03/17/2018 0105   RBC 5.25 09/12/2023 0816   RBC 5.35 03/17/2018 0105   HGB 15.4 09/12/2023 0816   HCT 45.9 09/12/2023 0816   PLT 259 09/12/2023 0816   MCV 87 09/12/2023 0816   MCH 29.3 09/12/2023 0816   MCH 27.9 03/17/2018 0105   MCHC 33.6 09/12/2023 0816   MCHC 32.5 03/17/2018 0105   RDW 13.2 09/12/2023 0816   LYMPHSABS 1.7 09/12/2023 0816   EOSABS 0.2 09/12/2023 0816   BASOSABS 0.1 09/12/2023 0816    BMET    Component Value Date/Time   NA 144 09/12/2023 0816   K 4.8 09/12/2023 0816   CL 106 09/12/2023 0816   CO2 22 09/12/2023 0816   GLUCOSE 89 09/12/2023 0816   GLUCOSE 106 (H) 03/17/2018 0105   BUN 12 09/12/2023 0816   CREATININE 1.36 (H) 09/12/2023 0816   CALCIUM 9.8 09/12/2023 0816   GFRNONAA 74 04/01/2018 1141   GFRAA 86 04/01/2018 1141    BNP No results found for: "BNP"  ProBNP No results found for: "PROBNP"  Imaging: No results found.  Administration History     None           No data to display          No results found for: "NITRICOXIDE"      Assessment & Plan:   OSA (obstructive sleep apnea) Severe OSA -discussed sleep study results .  Patient education on sleep apnea and treatment options.  Patient will proceed with CPAP therapy.  Begin auto CPAP 5 to 15 cm H2O.  - discussed how weight can impact sleep and risk for sleep disordered breathing - discussed options to assist with weight loss: combination of diet modification, cardiovascular and  strength training exercises   - had an extensive discussion regarding the adverse health consequences related to untreated sleep disordered breathing - specifically discussed the risks for hypertension, coronary artery disease, cardiac dysrhythmias, cerebrovascular disease, and diabetes - lifestyle modification discussed   - discussed how sleep disruption can increase risk of accidents, particularly when driving - safe driving practices were discussed   Plan  Patient Instructions  Begin CPAP therapy at bedtime, wear all  night long for at least 6 or more hours Work on healthy weight loss Use caution with sedating medications Do not drive if sleepy Follow-up in 3 months and as needed    CKD (chronic kidney disease), stage III (HCC) Continue follow-up with nephrology.  Hypertension Continue follow-up with primary care.  BMI 36.0-36.9,adult Continue to work on healthy weight loss.      Roena Clark, NP 09/26/2023

## 2023-09-26 NOTE — Assessment & Plan Note (Signed)
 Continue follow-up with nephrology.

## 2023-09-26 NOTE — Assessment & Plan Note (Signed)
Continue follow-up with primary care 

## 2023-10-09 ENCOUNTER — Other Ambulatory Visit: Payer: Self-pay | Admitting: Family Medicine

## 2023-10-09 DIAGNOSIS — M1A0710 Idiopathic chronic gout, right ankle and foot, without tophus (tophi): Secondary | ICD-10-CM

## 2023-11-19 ENCOUNTER — Telehealth: Payer: Self-pay | Admitting: Adult Health

## 2023-11-19 NOTE — Telephone Encounter (Signed)
 Spoke with patient to schedule the 3 month follow up with Tammy Parrett, NP--patient staes he has not started using his CPAP machine yet and will call us  back when he is ready to schedul his follow up

## 2023-11-24 DIAGNOSIS — N189 Chronic kidney disease, unspecified: Secondary | ICD-10-CM | POA: Diagnosis not present

## 2023-12-01 DIAGNOSIS — N2581 Secondary hyperparathyroidism of renal origin: Secondary | ICD-10-CM | POA: Diagnosis not present

## 2023-12-01 DIAGNOSIS — N182 Chronic kidney disease, stage 2 (mild): Secondary | ICD-10-CM | POA: Diagnosis not present

## 2023-12-01 DIAGNOSIS — R809 Proteinuria, unspecified: Secondary | ICD-10-CM | POA: Diagnosis not present

## 2024-02-24 DIAGNOSIS — Z23 Encounter for immunization: Secondary | ICD-10-CM | POA: Diagnosis not present

## 2024-03-15 ENCOUNTER — Other Ambulatory Visit: Payer: Self-pay | Admitting: *Deleted

## 2024-03-15 DIAGNOSIS — I10 Essential (primary) hypertension: Secondary | ICD-10-CM

## 2024-03-17 ENCOUNTER — Encounter: Payer: Self-pay | Admitting: Family Medicine

## 2024-03-17 ENCOUNTER — Ambulatory Visit: Payer: Self-pay | Admitting: Family Medicine

## 2024-03-17 VITALS — BP 131/89 | HR 88 | Temp 98.2°F | Ht 70.0 in | Wt 243.0 lb

## 2024-03-17 DIAGNOSIS — Z0000 Encounter for general adult medical examination without abnormal findings: Secondary | ICD-10-CM

## 2024-03-17 DIAGNOSIS — N183 Chronic kidney disease, stage 3 unspecified: Secondary | ICD-10-CM

## 2024-03-17 DIAGNOSIS — Z125 Encounter for screening for malignant neoplasm of prostate: Secondary | ICD-10-CM | POA: Diagnosis not present

## 2024-03-17 DIAGNOSIS — I10 Essential (primary) hypertension: Secondary | ICD-10-CM

## 2024-03-17 DIAGNOSIS — E785 Hyperlipidemia, unspecified: Secondary | ICD-10-CM | POA: Diagnosis not present

## 2024-03-17 DIAGNOSIS — Z0001 Encounter for general adult medical examination with abnormal findings: Secondary | ICD-10-CM

## 2024-03-17 MED ORDER — AMLODIPINE BESYLATE 10 MG PO TABS: 10.0000 mg | ORAL_TABLET | Freq: Every day | ORAL | 3 refills | Status: AC

## 2024-03-17 MED ORDER — LISINOPRIL 40 MG PO TABS: 40.0000 mg | ORAL_TABLET | Freq: Every day | ORAL | 3 refills | Status: AC

## 2024-03-17 NOTE — Progress Notes (Signed)
 BP 131/89   Pulse 88   Temp 98.2 F (36.8 C)   Ht 5' 10 (1.778 m)   Wt 243 lb (110.2 kg)   SpO2 97%   BMI 34.87 kg/m    Subjective:   Patient ID: Jordan Webb, male    DOB: 03-19-1971, 53 y.o.   MRN: [id number]  HPI: Jordan Webb is a 53 y.o. male presenting on 03/17/2024 for Medical Management of Chronic Issues (CPE) and Hypertension   Discussed the use of AI scribe software for clinical note transcription with the patient, who gave verbal consent to proceed.  History of Present Illness   Jordan Webb is a 53 year old male who presents for an annual physical exam.  Hypertension - Blood pressure at home typically ranges in the mid to high 120s systolic over low to mid 80s diastolic - Continues daily amlodipine  for blood pressure management  Chronic kidney disease - Currently taking Farxiga for kidney health - Kidney function tests have remained stable per nephrologist - Regularly takes vitamin D supplementation  Shoulder arthralgia - Bilateral shoulder pain present for 8-9 months - Pain described as throbbing and intermittent - Shoulders sometimes pop with movement, especially at night - No specific movements identified that exacerbate the pain  Sleep-disordered breathing - Significant snoring observed by partner - Previously tested for sleep apnea and advised to get fitted for a CPAP mask - Has not obtained CPAP mask due to insurance changes  Preventive health maintenance - Did not receive previously ordered Cologuard test kit - Received influenza vaccination at work - Considering pneumococcal vaccination as he approaches age 73  Genitourinary symptoms - No urinary issues such as trouble starting a stream          Relevant past medical, surgical, family and social history reviewed and updated as indicated. Interim medical history since our last visit reviewed. Allergies and medications reviewed and updated.  Review of Systems  Constitutional:   Negative for chills and fever.  HENT:  Negative for ear pain and tinnitus.   Eyes:  Negative for pain and discharge.  Respiratory:  Negative for cough, shortness of breath and wheezing.   Cardiovascular:  Negative for chest pain, palpitations and leg swelling.  Gastrointestinal:  Negative for abdominal pain, blood in stool, constipation and diarrhea.  Genitourinary:  Negative for dysuria and hematuria.  Musculoskeletal:  Positive for arthralgias. Negative for back pain, gait problem and myalgias.  Skin:  Negative for rash.  Neurological:  Negative for dizziness, weakness and headaches.  Psychiatric/Behavioral:  Negative for suicidal ideas.   All other systems reviewed and are negative.   Per HPI unless specifically indicated above   Allergies as of 03/17/2024       Reactions   Prednisone Other (See Comments)        Medication List        Accurate as of March 17, 2024  8:16 AM. If you have any questions, ask your nurse or doctor.          amLODipine  10 MG tablet Commonly known as: NORVASC  Take 1 tablet (10 mg total) by mouth daily.   colchicine  0.6 MG tablet TAKE 1 TABLET DAILY AS NEEDED   Farxiga 10 MG Tabs tablet Generic drug: dapagliflozin propanediol Take 10 mg by mouth daily.   halobetasol 0.05 % cream Commonly known as: ULTRAVATE Apply topically 2 (two) times daily as needed.   lisinopril  40 MG tablet Commonly known as: ZESTRIL  Take 1 tablet (40 mg  total) by mouth daily.   mineral oil-hydrophilic petrolatum ointment Apply topically as needed for dry skin.   VITAMIN D (ERGOCALCIFEROL) PO Take by mouth.         Objective:   BP 131/89   Pulse 88   Temp 98.2 F (36.8 C)   Ht 5' 10 (1.778 m)   Wt 243 lb (110.2 kg)   SpO2 97%   BMI 34.87 kg/m   Wt Readings from Last 3 Encounters:  03/17/24 243 lb (110.2 kg)  09/26/23 251 lb (113.9 kg)  09/12/23 247 lb (112 kg)    Physical Exam Physical Exam   VITALS: BP- 131/89 HEENT: Right ear  with minimal cerumen, otherwise normal. NECK: Thyroid without nodules or enlargement. CHEST: Lungs clear to auscultation bilaterally. CARDIOVASCULAR: Regular heart rate and rhythm, no murmurs. ABDOMEN: Abdomen non-tender to palpation. EXTREMITIES: No edema in lower extremities. MUSCULOSKELETAL: Shoulders without rotator cuff tenderness or abnormalities. NEUROLOGICAL: Reflexes normal.         Assessment & Plan:   Problem List Items Addressed This Visit       Cardiovascular and Mediastinum   Essential hypertension, benign   Relevant Medications   amLODipine  (NORVASC ) 10 MG tablet   lisinopril  (ZESTRIL ) 40 MG tablet   Other Relevant Orders   CBC with Differential/Platelet   CMP14+EGFR     Genitourinary   CKD (chronic kidney disease), stage III (HCC)   Relevant Orders   CBC with Differential/Platelet   CMP14+EGFR     Other   Dyslipidemia   Relevant Orders   Lipid panel   Other Visit Diagnoses       Physical exam    -  Primary   Relevant Orders   CBC with Differential/Platelet   CMP14+EGFR   Lipid panel   PSA, total and free     Prostate cancer screening       Relevant Orders   PSA, total and free          Shoulder Pain Intermittent shoulder pain likely due to mild arthritis. - Manage pain with acetaminophen  and topical arthritis gel or cream. - Use heating pads for symptomatic relief. - Consider further evaluation if symptoms worsen.  Hypertension Blood pressure well-controlled with amlodipine . In-office reading slightly elevated due to situational factors. - Continue amlodipine  as prescribed. - Monitor blood pressure at home periodically.  Chronic Kidney Disease Kidney function stable per nephrologist reports. Current blood work to monitor function. - Continue Farxiga as prescribed. - Check kidney function tests with current blood work.  Obstructive Sleep Apnea Significant snoring suggests potential benefit from CPAP therapy. - Contact previous  provider for CPAP fitting. - Offer new referral if insurance issues persist.  General Health Maintenance Discussed pneumonia vaccination as he approaches 59 years. - Reorder Cologuard test. - Perform prostate cancer screening via blood work. - Discuss pneumonia vaccination as he approaches 59 years.          Follow up plan: Return in about 6 months (around 09/14/2024) for Hypertension and cholesterol recheck.  Counseling provided for all of the vaccine components Orders Placed This Encounter  Procedures   CBC with Differential/Platelet   CMP14+EGFR   Lipid panel   PSA, total and free    Fonda Levins, MD [facility] 03/17/2024, 8:16 AM

## 2024-03-18 LAB — CMP14+EGFR
ALT: 20 IU/L (ref 0–44)
AST: 19 IU/L (ref 0–40)
Albumin: 4.3 g/dL (ref 3.8–4.9)
Alkaline Phosphatase: 53 IU/L (ref 47–123)
BUN/Creatinine Ratio: 8 — ABNORMAL LOW (ref 9–20)
BUN: 12 mg/dL (ref 6–24)
Bilirubin Total: 0.4 mg/dL (ref 0.0–1.2)
CO2: 23 mmol/L (ref 20–29)
Calcium: 9.1 mg/dL (ref 8.7–10.2)
Chloride: 107 mmol/L — ABNORMAL HIGH (ref 96–106)
Creatinine, Ser: 1.42 mg/dL — ABNORMAL HIGH (ref 0.76–1.27)
Globulin, Total: 2.4 g/dL (ref 1.5–4.5)
Glucose: 93 mg/dL (ref 70–99)
Potassium: 4.5 mmol/L (ref 3.5–5.2)
Sodium: 142 mmol/L (ref 134–144)
Total Protein: 6.7 g/dL (ref 6.0–8.5)
eGFR: 59 mL/min/1.73 — ABNORMAL LOW (ref >59)

## 2024-03-18 LAB — CBC WITH DIFFERENTIAL/PLATELET
Basophils Absolute: 0 x10E3/uL (ref 0.0–0.2)
Basos: 1 %
EOS (ABSOLUTE): 0.1 x10E3/uL (ref 0.0–0.4)
Eos: 3 %
Hematocrit: 46.7 % (ref 37.5–51.0)
Hemoglobin: 15.5 g/dL (ref 13.0–17.7)
Immature Grans (Abs): 0 x10E3/uL (ref 0.0–0.1)
Immature Granulocytes: 0 %
Lymphocytes Absolute: 1.4 x10E3/uL (ref 0.7–3.1)
Lymphs: 29 %
MCH: 28.9 pg (ref 26.6–33.0)
MCHC: 33.2 g/dL (ref 31.5–35.7)
MCV: 87 fL (ref 79–97)
Monocytes Absolute: 0.5 x10E3/uL (ref 0.1–0.9)
Monocytes: 9 %
Neutrophils Absolute: 3 x10E3/uL (ref 1.4–7.0)
Neutrophils: 58 %
Platelets: 225 x10E3/uL (ref 150–450)
RBC: 5.36 x10E6/uL (ref 4.14–5.80)
RDW: 13.1 % (ref 11.6–15.4)
WBC: 5 x10E3/uL (ref 3.4–10.8)

## 2024-03-18 LAB — PSA, TOTAL AND FREE
PSA, Free Pct: 37.5 %
PSA, Free: 0.15 ng/mL
Prostate Specific Ag, Serum: 0.4 ng/mL (ref 0.0–4.0)

## 2024-03-18 LAB — LIPID PANEL
Chol/HDL Ratio: 4 ratio (ref 0.0–5.0)
Cholesterol, Total: 156 mg/dL (ref 100–199)
HDL: 39 mg/dL — ABNORMAL LOW (ref >39)
LDL Chol Calc (NIH): 93 mg/dL (ref 0–99)
Triglycerides: 136 mg/dL (ref 0–149)
VLDL Cholesterol Cal: 24 mg/dL (ref 5–40)

## 2024-03-22 ENCOUNTER — Ambulatory Visit: Payer: Self-pay | Admitting: Family Medicine

## 2024-09-15 ENCOUNTER — Ambulatory Visit: Admitting: Family Medicine
